# Patient Record
Sex: Male | Born: 1935 | Race: White | Hispanic: No | Marital: Married | Smoking: Former smoker
Health system: Southern US, Community
[De-identification: ages and names within clinical notes are randomized; demographics above are authoritative.]

## PROBLEM LIST (undated history)

## (undated) DIAGNOSIS — J449 Chronic obstructive pulmonary disease, unspecified: Secondary | ICD-10-CM

## (undated) DIAGNOSIS — E7800 Pure hypercholesterolemia, unspecified: Secondary | ICD-10-CM

## (undated) DIAGNOSIS — C801 Malignant (primary) neoplasm, unspecified: Secondary | ICD-10-CM

## (undated) DIAGNOSIS — D649 Anemia, unspecified: Secondary | ICD-10-CM

## (undated) DIAGNOSIS — I10 Essential (primary) hypertension: Secondary | ICD-10-CM

## (undated) HISTORY — DX: Anemia, unspecified: D64.9

## (undated) HISTORY — PX: PROSTATECTOMY: SHX69

## (undated) HISTORY — PX: TONSILLECTOMY: SUR1361

## (undated) HISTORY — PX: APPENDECTOMY: SHX54

## (undated) HISTORY — PX: BACK SURGERY: SHX140

---

## 2014-08-03 ENCOUNTER — Ambulatory Visit: Payer: Self-pay | Admitting: Nephrology

## 2014-08-09 ENCOUNTER — Ambulatory Visit: Payer: Self-pay | Admitting: Internal Medicine

## 2014-08-09 LAB — CANCER CENTER HEMOGLOBIN: HGB: 8.8 g/dL — ABNORMAL LOW (ref 13.0–18.0)

## 2014-08-10 LAB — KAPPA/LAMBDA FREE LIGHT CHAINS (ARMC)

## 2014-08-12 ENCOUNTER — Ambulatory Visit: Payer: Self-pay | Admitting: Internal Medicine

## 2014-08-19 LAB — CANCER CENTER HEMOGLOBIN: HGB: 8.8 g/dL — ABNORMAL LOW (ref 13.0–18.0)

## 2014-08-26 LAB — CANCER CENTER HEMOGLOBIN: HGB: 9.1 g/dL — AB (ref 13.0–18.0)

## 2014-09-02 LAB — CANCER CENTER HEMOGLOBIN: HGB: 9.3 g/dL — AB (ref 13.0–18.0)

## 2014-09-09 LAB — CANCER CENTER HEMOGLOBIN: HGB: 9.6 g/dL — ABNORMAL LOW (ref 13.0–18.0)

## 2014-09-12 ENCOUNTER — Ambulatory Visit: Payer: Self-pay | Admitting: Internal Medicine

## 2014-09-16 LAB — CANCER CENTER HEMOGLOBIN: HGB: 11 g/dL — AB (ref 13.0–18.0)

## 2014-09-29 LAB — CBC CANCER CENTER
Basophil #: 0.1 x10 3/mm (ref 0.0–0.1)
Basophil %: 0.7 %
Eosinophil #: 0 x10 3/mm (ref 0.0–0.7)
Eosinophil %: 0.6 %
HCT: 32.7 % — ABNORMAL LOW (ref 40.0–52.0)
HGB: 10.3 g/dL — ABNORMAL LOW (ref 13.0–18.0)
Lymphocyte #: 2.1 x10 3/mm (ref 1.0–3.6)
Lymphocyte %: 25.8 %
MCH: 30.9 pg (ref 26.0–34.0)
MCHC: 31.5 g/dL — ABNORMAL LOW (ref 32.0–36.0)
MCV: 98 fL (ref 80–100)
MONOS PCT: 5 %
Monocyte #: 0.4 x10 3/mm (ref 0.2–1.0)
Neutrophil #: 5.5 x10 3/mm (ref 1.4–6.5)
Neutrophil %: 67.9 %
Platelet: 266 x10 3/mm (ref 150–440)
RBC: 3.33 10*6/uL — ABNORMAL LOW (ref 4.40–5.90)
RDW: 16.5 % — ABNORMAL HIGH (ref 11.5–14.5)
WBC: 8.2 x10 3/mm (ref 3.8–10.6)

## 2014-10-04 LAB — CANCER CENTER HEMOGLOBIN: HGB: 10.4 g/dL — ABNORMAL LOW (ref 13.0–18.0)

## 2014-10-12 ENCOUNTER — Ambulatory Visit: Payer: Self-pay | Admitting: Internal Medicine

## 2014-10-14 LAB — CANCER CENTER HEMOGLOBIN: HGB: 9.5 g/dL — ABNORMAL LOW (ref 13.0–18.0)

## 2014-10-25 LAB — CANCER CENTER HEMOGLOBIN: HGB: 9.6 g/dL — AB (ref 13.0–18.0)

## 2014-11-03 LAB — FERRITIN: FERRITIN (ARMC): 44 ng/mL (ref 8–388)

## 2014-11-03 LAB — CANCER CENTER HEMOGLOBIN: HGB: 10.7 g/dL — ABNORMAL LOW (ref 13.0–18.0)

## 2014-11-12 ENCOUNTER — Ambulatory Visit: Payer: Self-pay | Admitting: Internal Medicine

## 2014-11-15 LAB — CANCER CENTER HEMOGLOBIN: HGB: 10.5 g/dL — ABNORMAL LOW (ref 13.0–18.0)

## 2014-11-25 LAB — CANCER CENTER HEMOGLOBIN: HGB: 10 g/dL — ABNORMAL LOW (ref 13.0–18.0)

## 2014-11-25 LAB — FERRITIN: Ferritin (ARMC): 127 ng/mL (ref 8–388)

## 2014-12-13 ENCOUNTER — Ambulatory Visit: Payer: Self-pay | Admitting: Internal Medicine

## 2014-12-16 LAB — CANCER CENTER HEMOGLOBIN: HGB: 9.9 g/dL — ABNORMAL LOW (ref 13.0–18.0)

## 2015-01-11 ENCOUNTER — Ambulatory Visit
Admit: 2015-01-11 | Disposition: A | Discharge status: Home / Self Care | Payer: Self-pay | Attending: Internal Medicine | Admitting: Internal Medicine

## 2015-02-11 ENCOUNTER — Ambulatory Visit
Admit: 2015-02-11 | Disposition: A | Discharge status: Home / Self Care | Payer: Self-pay | Attending: Internal Medicine | Admitting: Internal Medicine

## 2015-02-16 LAB — CANCER CENTER HEMOGLOBIN: HGB: 9.2 g/dL — ABNORMAL LOW (ref 13.0–18.0)

## 2015-03-09 LAB — CANCER CENTER HEMOGLOBIN: HGB: 8.9 g/dL — ABNORMAL LOW (ref 13.0–18.0)

## 2015-03-28 ENCOUNTER — Other Ambulatory Visit: Payer: Self-pay | Admitting: *Deleted

## 2015-03-28 DIAGNOSIS — N189 Chronic kidney disease, unspecified: Principal | ICD-10-CM

## 2015-03-28 DIAGNOSIS — D631 Anemia in chronic kidney disease: Secondary | ICD-10-CM

## 2015-03-30 ENCOUNTER — Inpatient Hospital Stay: Payer: Medicare Other | Attending: Internal Medicine

## 2015-03-30 ENCOUNTER — Inpatient Hospital Stay (HOSPITAL_BASED_OUTPATIENT_CLINIC_OR_DEPARTMENT_OTHER): Payer: Medicare Other | Admitting: Internal Medicine

## 2015-03-30 ENCOUNTER — Inpatient Hospital Stay: Payer: Medicare Other

## 2015-03-30 VITALS — BP 145/60 | HR 51 | Temp 98.5°F | Resp 18 | Wt 143.3 lb

## 2015-03-30 DIAGNOSIS — I651 Occlusion and stenosis of basilar artery: Secondary | ICD-10-CM | POA: Insufficient documentation

## 2015-03-30 DIAGNOSIS — F418 Other specified anxiety disorders: Secondary | ICD-10-CM | POA: Insufficient documentation

## 2015-03-30 DIAGNOSIS — J449 Chronic obstructive pulmonary disease, unspecified: Secondary | ICD-10-CM

## 2015-03-30 DIAGNOSIS — D649 Anemia, unspecified: Secondary | ICD-10-CM

## 2015-03-30 DIAGNOSIS — Z87891 Personal history of nicotine dependence: Secondary | ICD-10-CM | POA: Diagnosis not present

## 2015-03-30 DIAGNOSIS — R300 Dysuria: Secondary | ICD-10-CM | POA: Diagnosis not present

## 2015-03-30 DIAGNOSIS — K219 Gastro-esophageal reflux disease without esophagitis: Secondary | ICD-10-CM | POA: Insufficient documentation

## 2015-03-30 DIAGNOSIS — I739 Peripheral vascular disease, unspecified: Secondary | ICD-10-CM | POA: Diagnosis not present

## 2015-03-30 DIAGNOSIS — R5383 Other fatigue: Secondary | ICD-10-CM | POA: Diagnosis not present

## 2015-03-30 DIAGNOSIS — N189 Chronic kidney disease, unspecified: Secondary | ICD-10-CM | POA: Diagnosis not present

## 2015-03-30 DIAGNOSIS — Z79899 Other long term (current) drug therapy: Secondary | ICD-10-CM

## 2015-03-30 DIAGNOSIS — R7989 Other specified abnormal findings of blood chemistry: Secondary | ICD-10-CM | POA: Diagnosis not present

## 2015-03-30 DIAGNOSIS — D631 Anemia in chronic kidney disease: Secondary | ICD-10-CM | POA: Insufficient documentation

## 2015-03-30 DIAGNOSIS — I951 Orthostatic hypotension: Secondary | ICD-10-CM

## 2015-03-30 LAB — URINALYSIS COMPLETE WITH MICROSCOPIC (ARMC ONLY)
Bilirubin Urine: NEGATIVE
Glucose, UA: 50 mg/dL — AB
Hgb urine dipstick: NEGATIVE
Ketones, ur: NEGATIVE mg/dL
LEUKOCYTES UA: NEGATIVE
Nitrite: NEGATIVE
PH: 5 (ref 5.0–8.0)
Protein, ur: 100 mg/dL — AB
Specific Gravity, Urine: 1.011 (ref 1.005–1.030)

## 2015-03-30 LAB — HEMOGLOBIN: HEMOGLOBIN: 9.5 g/dL — AB (ref 13.0–18.0)

## 2015-03-30 LAB — FERRITIN: FERRITIN: 203 ng/mL (ref 24–336)

## 2015-03-30 MED ORDER — EPOETIN ALFA 40000 UNIT/ML IJ SOLN
40000.0000 [IU] | Freq: Once | INTRAMUSCULAR | Status: AC
  Administered 2015-03-30: 40000 [IU] via SUBCUTANEOUS
  Filled 2015-03-30: qty 1

## 2015-03-30 NOTE — Progress Notes (Signed)
[facility] Progress note   Referred by nephrology Dr. Zollie Scale. Primary care physicians have been a physicians making house call although he is recently changed doctors   This 81 y.o. male patient presents to the clinic for follow-up of anemia related to chronic renal failure   Chief Complaint/Problem list  1. anemia, hgb 8.7 on 07/15/14, attrubute tto azotemia, cre was 3.0, egfr 18., renal u/s consistent with medical rewnal disease recently iron sat , ferritin, spep normal, no urine, normal light chains documented  prior on aranesp in [place], pos ana and anti ds dna abs recently moved to [employer] 2. borderline neutrophilia likely spurrious , normal on 09/29/14   3. hx ca prostate   HPI: Today patient returns to follow-up, we will watch his hemoglobin, giving him Procrit when necessary, today he has an additional problem of burning urination and frequency at night for 2 weeks but not during the day and no suprapubic pain no fever chills sweats no back pain. He reports that his blood pressure is been up and down is on a new medication hydralazine his kidney function has declined that he is just seen Dr. Zollie Scale and has follow-up planned he's been coming in for hemoglobin check with the plan to give Procrit support 40,000 units when necessary for hemoglobin less than 10.3     Review of Systems:  No current headache or dizziness no chest pain or palpitation no abdominal pain no focal weakness not short of breath                        Allergies Not on File  Significant History/PMH: He has chronic renal failure, COPD with when necessary oxygen, anxiety and depression appendectomy, history of subdural hematoma, except postural hypotension GERD and peripheral vascular disease        Smoking History:  smoker for 40 years quit in 2000  [place]: Family History: Negative for malignancy   Comments:   Social History:  History  Alcohol Use: Not on file    Additional Past Medical and Surgical  History:    Home Medications: Prior to Admission medications   Medication Sig Start Date End Date Taking? Authorizing Provider  hydroxychloroquine (PLAQUENIL) 200 MG tablet Take by mouth. 11/15/14 11/15/15 Yes Historical Provider, MD  albuterol (PROVENTIL) (2.5 MG/3ML) 0.083% nebulizer solution Inhale into the lungs.    Historical Provider, MD  amLODipine (NORVASC) 10 MG tablet Take by mouth.    Historical Provider, MD  budesonide-formoterol (SYMBICORT) 160-4.5 MCG/ACT inhaler Inhale into the lungs.    Historical Provider, MD  calcitRIOL (ROCALTROL) 0.25 MCG capsule Take by mouth.    Historical Provider, MD  docusate sodium (COLACE) 100 MG capsule Take by mouth.    Historical Provider, MD  FLUoxetine (PROZAC) 20 MG capsule Take by mouth.    Historical Provider, MD  irbesartan (AVAPRO) 300 MG tablet Take by mouth.    Historical Provider, MD  meclizine (ANTIVERT) 12.5 MG tablet Take by mouth.    Historical Provider, MD  metoprolol (LOPRESSOR) 100 MG tablet Take by mouth.    Historical Provider, MD  OLANZapine (ZYPREXA) 2.5 MG tablet Take by mouth.    Historical Provider, MD  rosuvastatin (CRESTOR) 20 MG tablet Take by mouth.    Historical Provider, MD  tiotropium (SPIRIVA) 18 MCG inhalation capsule Place into inhaler and inhale.    Historical Provider, MD    Vital Signs:  Blood pressure 145/60, pulse 51, temperature 98.5 F (36.9 C), temperature source Tympanic,  resp. rate 18, weight 143 lb 4.8 oz (65 kg), SpO2 96 %.  Physical Exam:  General:  no acute distress  Mental Status: alert and oriented   Head, Ears, Nose,Throat: No thrush  Respiratory: no rales, rhonchi, or wheezing,   Cardiovascular: regular rate and rhythm  Gastrointestinal: soft, non tender, no masses or organomegaly  Musculoskeletal: no lower extremity edema     Neurological: No gross focal weakness cranial nerves intact  Lymphatics: Not palpable, neck supraclavicular, submandibular,     Psych: Mood, Affect, Unremarkable     Laboratory Results: Appointment on 03/30/2015  Component Date Value Ref Range Status  . Hemoglobin 03/30/2015 9.5* 13.0 - 18.0 g/dL Final  . Ferritin 03/30/2015 203  24 - 336 ng/mL Final  . Color, Urine 03/30/2015 YELLOW* YELLOW Final  . APPearance 03/30/2015 CLEAR* CLEAR Final  . Glucose, UA 03/30/2015 50* NEGATIVE mg/dL Final  . Bilirubin Urine 03/30/2015 NEGATIVE  NEGATIVE Final  . Ketones, ur 03/30/2015 NEGATIVE  NEGATIVE mg/dL Final  . Specific Gravity, Urine 03/30/2015 1.011  1.005 - 1.030 Final  . Hgb urine dipstick 03/30/2015 NEGATIVE  NEGATIVE Final  . pH 03/30/2015 5.0  5.0 - 8.0 Final  . Protein, ur 03/30/2015 100* NEGATIVE mg/dL Final  . Nitrite 03/30/2015 NEGATIVE  NEGATIVE Final  . Leukocytes, UA 03/30/2015 NEGATIVE  NEGATIVE Final  . RBC / HPF 03/30/2015 0-5  0 - 5 RBC/hpf Final  . WBC, UA 03/30/2015 0-5  0 - 5 WBC/hpf Final  . Bacteria, UA 03/30/2015 RARE* NONE SEEN Final  . Squamous Epithelial / LPF 03/30/2015 0-5* NONE SEEN Final  . Mucous 03/30/2015 PRESENT   Final          Radiology Results: No results found.         Assessment and Plan: Impression: ANEMIA ATTRIBUTE TO RENAL DISEASE, ELECTROPHERESIS, INCLUDING LIGHT CHAINS  NRMAL.[name] ABNORMAL SEROLOGY/ANA.  BORDERLINE NEUTROPHILIA, last recheck was normal. .  MILD SYMPTOMATIC WITH FATIGUE.PRIOR HX OF ARANESP . NOW S/P procrit prn, today hgb below target   also today has this urinary frequency and some burning sensations at night. The UA has been done it's is essentially looks normal. Culture is still pending.    Plan: Patient will increase his by mouth fluids. We will check if any positive culture. He'll report any new symptoms to Dr. Zollie Scale. We'll continue Procrit for now 40,000 units every 3 weeks. At intervals can check iron and be candidate for by mouth iron or intravenous iron possibly when necessary

## 2015-04-01 LAB — URINE CULTURE: Culture: NO GROWTH

## 2015-04-04 ENCOUNTER — Telehealth: Payer: Self-pay | Admitting: *Deleted

## 2015-04-04 NOTE — Telephone Encounter (Signed)
He was going to get labs drawn at [facility] to save time, but they have decided to have them drawn here to keep from coming twice to [place]. He will need appts and orders for his labs

## 2015-04-08 NOTE — Telephone Encounter (Signed)
I called Chad Simpson to inform him that this would be taken care of with his next appt. This was at 1519 on 04/08/15... This morning, 04/08/15 there was a message left from 04/05/15 off of my desk voicemail that the Chad Simpson had been trying to get through to the office 4-5 times regarding a UTI and possible antibiotic from [place]. When I asked Chad Simpson about this he stated that he has not called the [facility] all week. I asked him how he felt and if he needed anything. He said everything was fine.

## 2015-04-12 ENCOUNTER — Telehealth: Payer: Self-pay | Admitting: *Deleted

## 2015-04-12 ENCOUNTER — Other Ambulatory Visit: Payer: Self-pay | Admitting: *Deleted

## 2015-04-12 DIAGNOSIS — D631 Anemia in chronic kidney disease: Secondary | ICD-10-CM

## 2015-04-12 DIAGNOSIS — N189 Chronic kidney disease, unspecified: Principal | ICD-10-CM

## 2015-04-12 NOTE — Telephone Encounter (Signed)
Pt's daughter stated that she would be unable to bring her father to his appt on 04/13/15 and 04/20/15; she requested an appt of 04/27/15 for labs and possible Procrit with f/u MD visit 3 weeks later.Chad KitchenMarland KitchenWould like to have a standing order for his Procrit based on his labs as well.Chad KitchenMarland Simpson

## 2015-04-13 ENCOUNTER — Ambulatory Visit: Payer: Medicare Other

## 2015-04-13 ENCOUNTER — Other Ambulatory Visit: Payer: Medicare Other

## 2015-04-20 ENCOUNTER — Ambulatory Visit: Payer: Medicare Other | Admitting: Internal Medicine

## 2015-04-27 ENCOUNTER — Ambulatory Visit: Payer: Medicare Other

## 2015-04-27 ENCOUNTER — Inpatient Hospital Stay: Payer: Medicare Other | Attending: Family Medicine

## 2015-04-27 VITALS — BP 157/73 | HR 59 | Temp 96.2°F | Resp 18

## 2015-04-27 DIAGNOSIS — Z79899 Other long term (current) drug therapy: Secondary | ICD-10-CM | POA: Diagnosis not present

## 2015-04-27 DIAGNOSIS — D631 Anemia in chronic kidney disease: Secondary | ICD-10-CM | POA: Insufficient documentation

## 2015-04-27 DIAGNOSIS — N189 Chronic kidney disease, unspecified: Secondary | ICD-10-CM

## 2015-04-27 DIAGNOSIS — D649 Anemia, unspecified: Secondary | ICD-10-CM | POA: Insufficient documentation

## 2015-04-27 DIAGNOSIS — D508 Other iron deficiency anemias: Secondary | ICD-10-CM

## 2015-04-27 HISTORY — DX: Anemia, unspecified: D64.9

## 2015-04-27 LAB — HEMOGLOBIN: HEMOGLOBIN: 8.8 g/dL — AB (ref 13.0–18.0)

## 2015-04-27 MED ORDER — EPOETIN ALFA 40000 UNIT/ML IJ SOLN
40000.0000 [IU] | Freq: Once | INTRAMUSCULAR | Status: AC
  Administered 2015-04-27: 40000 [IU] via SUBCUTANEOUS
  Filled 2015-04-27: qty 1

## 2015-05-04 ENCOUNTER — Other Ambulatory Visit: Payer: Medicare Other

## 2015-05-04 ENCOUNTER — Ambulatory Visit: Payer: Medicare Other | Admitting: Family Medicine

## 2015-05-04 ENCOUNTER — Ambulatory Visit: Payer: Medicare Other

## 2015-05-04 ENCOUNTER — Other Ambulatory Visit: Payer: Self-pay | Admitting: *Deleted

## 2015-05-04 DIAGNOSIS — D649 Anemia, unspecified: Secondary | ICD-10-CM

## 2015-05-04 MED ORDER — FERROUS FUM-IRON POLYSACCH 162-115.2 MG PO CAPS: ORAL_CAPSULE | ORAL | Status: DC

## 2015-05-18 ENCOUNTER — Inpatient Hospital Stay: Payer: Medicare Other | Attending: Family Medicine

## 2015-05-18 ENCOUNTER — Telehealth: Payer: Self-pay | Admitting: *Deleted

## 2015-05-18 ENCOUNTER — Ambulatory Visit: Payer: Medicare Other

## 2015-05-18 ENCOUNTER — Inpatient Hospital Stay: Payer: Medicare Other | Admitting: Family Medicine

## 2015-05-18 ENCOUNTER — Encounter: Payer: Self-pay | Admitting: *Deleted

## 2015-05-18 ENCOUNTER — Ambulatory Visit: Payer: Medicare Other | Admitting: Family Medicine

## 2015-05-18 ENCOUNTER — Inpatient Hospital Stay: Payer: Medicare Other

## 2015-05-18 ENCOUNTER — Other Ambulatory Visit: Payer: Medicare Other

## 2015-05-18 VITALS — BP 109/64 | HR 55 | Temp 98.0°F | Resp 18

## 2015-05-18 DIAGNOSIS — D509 Iron deficiency anemia, unspecified: Secondary | ICD-10-CM

## 2015-05-18 DIAGNOSIS — D508 Other iron deficiency anemias: Secondary | ICD-10-CM

## 2015-05-18 DIAGNOSIS — D631 Anemia in chronic kidney disease: Secondary | ICD-10-CM | POA: Diagnosis not present

## 2015-05-18 DIAGNOSIS — N189 Chronic kidney disease, unspecified: Secondary | ICD-10-CM | POA: Diagnosis not present

## 2015-05-18 LAB — HEMOGLOBIN: Hemoglobin: 8.6 g/dL — ABNORMAL LOW (ref 13.0–18.0)

## 2015-05-18 LAB — FERRITIN: Ferritin: 208 ng/mL (ref 24–336)

## 2015-05-18 MED ORDER — EPOETIN ALFA 40000 UNIT/ML IJ SOLN
40000.0000 [IU] | Freq: Once | INTRAMUSCULAR | Status: DC
  Filled 2015-05-18: qty 1

## 2015-05-18 NOTE — Progress Notes (Signed)
I spoke with pt's daughter, Chad Simpson,  today and was informed that she would be unable to bring him to his 8 scheduled lab/Procrit visits; she scheduled 5 of the 8 visits with Chad Simpson.  She stated that she was concerned about him possibly receiving Feraheme as he has poor kidney function. He is no longer seeing his nephrologist due to his refusal of dialysis. Was wondering how the Procrit injections and Feraheme would affect her father's already declining kidney function. Stated that he already had a PMD and didn't see the need for frequent trips to the [facility]. I spoke with her some about anemia, and labs such as Hct and Ferritin levels; then gave her information on Procrit, and Feraheme along with her father's Hct results so that she could see how his levels trended. I also told her, as discussed with Chad Simpson, AGNP-C ,that the NP would like for Chad Simpson to continue taking his oral iron pills even if he should come in for Feraheme.Marland KitchenMarland KitchenAnd that I would give her a call back this afternoon to give her his Ferritin results.Marland KitchenMarland Kitchen

## 2015-05-18 NOTE — Telephone Encounter (Signed)
I called and informed pt's daughter that her father's ferritin level was 208 today; and he did not need any Feraheme at this time.

## 2015-05-19 NOTE — Progress Notes (Signed)
This encounter was created in error - please disregard.

## 2015-05-20 ENCOUNTER — Telehealth: Payer: Self-pay | Admitting: *Deleted

## 2015-05-20 NOTE — Telephone Encounter (Signed)
Returned call and left msg on vm that a 15 week supply was sent to [facility] on 6/22

## 2015-05-23 ENCOUNTER — Telehealth: Payer: Self-pay | Admitting: *Deleted

## 2015-05-23 NOTE — Telephone Encounter (Signed)
Per daughter's request, pt's last note was faxed to his Internal Medicine physician, Perrin Smack, MD with [facility] at [phone]; their pH. # is [phone].Marland KitchenMarland Kitchen

## 2015-05-25 ENCOUNTER — Other Ambulatory Visit: Payer: Medicare Other

## 2015-05-31 ENCOUNTER — Encounter: Admission: RE | Payer: Self-pay | Source: Ambulatory Visit

## 2015-05-31 ENCOUNTER — Ambulatory Visit: Admission: RE | Admit: 2015-05-31 | Payer: Medicare Other | Source: Ambulatory Visit | Admitting: Ophthalmology

## 2015-05-31 SURGERY — PHACOEMULSIFICATION, CATARACT, WITH IOL INSERTION
Anesthesia: Choice | Laterality: Left

## 2015-06-01 ENCOUNTER — Inpatient Hospital Stay: Payer: Medicare Other

## 2015-06-01 VITALS — BP 130/66 | HR 52 | Temp 98.5°F | Resp 18

## 2015-06-01 DIAGNOSIS — D509 Iron deficiency anemia, unspecified: Secondary | ICD-10-CM

## 2015-06-01 DIAGNOSIS — D631 Anemia in chronic kidney disease: Secondary | ICD-10-CM | POA: Diagnosis not present

## 2015-06-01 LAB — HEMOGLOBIN: Hemoglobin: 9.4 g/dL — ABNORMAL LOW (ref 13.0–18.0)

## 2015-06-01 MED ORDER — EPOETIN ALFA 40000 UNIT/ML IJ SOLN
40000.0000 [IU] | Freq: Once | INTRAMUSCULAR | Status: AC
  Administered 2015-06-01: 40000 [IU] via SUBCUTANEOUS
  Filled 2015-06-01: qty 1

## 2015-06-08 ENCOUNTER — Inpatient Hospital Stay: Payer: Medicare Other

## 2015-06-08 VITALS — BP 119/61 | HR 55 | Resp 18

## 2015-06-08 DIAGNOSIS — D509 Iron deficiency anemia, unspecified: Secondary | ICD-10-CM

## 2015-06-08 DIAGNOSIS — D631 Anemia in chronic kidney disease: Secondary | ICD-10-CM | POA: Diagnosis not present

## 2015-06-08 LAB — HEMOGLOBIN: HEMOGLOBIN: 9.9 g/dL — AB (ref 13.0–18.0)

## 2015-06-08 MED ORDER — EPOETIN ALFA 40000 UNIT/ML IJ SOLN
40000.0000 [IU] | Freq: Once | INTRAMUSCULAR | Status: AC
  Administered 2015-06-08: 40000 [IU] via SUBCUTANEOUS
  Filled 2015-06-08: qty 1

## 2015-06-12 ENCOUNTER — Emergency Department: Payer: Medicare Other

## 2015-06-12 ENCOUNTER — Ambulatory Visit: Payer: Self-pay | Admitting: Student

## 2015-06-12 ENCOUNTER — Inpatient Hospital Stay
Admission: EM | Admit: 2015-06-12 | Discharge: 2015-06-16 | DRG: 470 | Disposition: A | Discharge status: Skilled Nursing Facility | Payer: Medicare Other | Attending: Internal Medicine | Admitting: Internal Medicine

## 2015-06-12 DIAGNOSIS — Z9889 Other specified postprocedural states: Secondary | ICD-10-CM | POA: Diagnosis not present

## 2015-06-12 DIAGNOSIS — Z79899 Other long term (current) drug therapy: Secondary | ICD-10-CM | POA: Diagnosis not present

## 2015-06-12 DIAGNOSIS — M25551 Pain in right hip: Secondary | ICD-10-CM

## 2015-06-12 DIAGNOSIS — J449 Chronic obstructive pulmonary disease, unspecified: Secondary | ICD-10-CM | POA: Diagnosis present

## 2015-06-12 DIAGNOSIS — Z7951 Long term (current) use of inhaled steroids: Secondary | ICD-10-CM

## 2015-06-12 DIAGNOSIS — Z9079 Acquired absence of other genital organ(s): Secondary | ICD-10-CM | POA: Diagnosis present

## 2015-06-12 DIAGNOSIS — M25569 Pain in unspecified knee: Secondary | ICD-10-CM | POA: Diagnosis present

## 2015-06-12 DIAGNOSIS — Y92009 Unspecified place in unspecified non-institutional (private) residence as the place of occurrence of the external cause: Secondary | ICD-10-CM | POA: Diagnosis not present

## 2015-06-12 DIAGNOSIS — E780 Pure hypercholesterolemia: Secondary | ICD-10-CM | POA: Diagnosis present

## 2015-06-12 DIAGNOSIS — Z96649 Presence of unspecified artificial hip joint: Secondary | ICD-10-CM

## 2015-06-12 DIAGNOSIS — Z859 Personal history of malignant neoplasm, unspecified: Secondary | ICD-10-CM | POA: Diagnosis not present

## 2015-06-12 DIAGNOSIS — K5900 Constipation, unspecified: Secondary | ICD-10-CM | POA: Diagnosis present

## 2015-06-12 DIAGNOSIS — N183 Chronic kidney disease, stage 3 (moderate): Secondary | ICD-10-CM | POA: Diagnosis present

## 2015-06-12 DIAGNOSIS — F329 Major depressive disorder, single episode, unspecified: Secondary | ICD-10-CM | POA: Diagnosis present

## 2015-06-12 DIAGNOSIS — I129 Hypertensive chronic kidney disease with stage 1 through stage 4 chronic kidney disease, or unspecified chronic kidney disease: Secondary | ICD-10-CM | POA: Diagnosis present

## 2015-06-12 DIAGNOSIS — E785 Hyperlipidemia, unspecified: Secondary | ICD-10-CM | POA: Diagnosis present

## 2015-06-12 DIAGNOSIS — S72001A Fracture of unspecified part of neck of right femur, initial encounter for closed fracture: Principal | ICD-10-CM | POA: Diagnosis present

## 2015-06-12 DIAGNOSIS — W1830XA Fall on same level, unspecified, initial encounter: Secondary | ICD-10-CM | POA: Diagnosis present

## 2015-06-12 DIAGNOSIS — Z87891 Personal history of nicotine dependence: Secondary | ICD-10-CM

## 2015-06-12 DIAGNOSIS — D62 Acute posthemorrhagic anemia: Secondary | ICD-10-CM | POA: Diagnosis not present

## 2015-06-12 DIAGNOSIS — Z9049 Acquired absence of other specified parts of digestive tract: Secondary | ICD-10-CM | POA: Diagnosis present

## 2015-06-12 DIAGNOSIS — S72009A Fracture of unspecified part of neck of unspecified femur, initial encounter for closed fracture: Secondary | ICD-10-CM | POA: Diagnosis present

## 2015-06-12 HISTORY — DX: Malignant (primary) neoplasm, unspecified: C80.1

## 2015-06-12 HISTORY — DX: Chronic obstructive pulmonary disease, unspecified: J44.9

## 2015-06-12 HISTORY — DX: Essential (primary) hypertension: I10

## 2015-06-12 HISTORY — DX: Pure hypercholesterolemia, unspecified: E78.00

## 2015-06-12 LAB — BASIC METABOLIC PANEL
ANION GAP: 5 (ref 5–15)
BUN: 50 mg/dL — AB (ref 6–20)
CHLORIDE: 115 mmol/L — AB (ref 101–111)
CO2: 22 mmol/L (ref 22–32)
Calcium: 9 mg/dL (ref 8.9–10.3)
Creatinine, Ser: 4.08 mg/dL — ABNORMAL HIGH (ref 0.61–1.24)
GFR calc non Af Amer: 12 mL/min — ABNORMAL LOW (ref >60)
GFR, EST AFRICAN AMERICAN: 14 mL/min — AB (ref >60)
Glucose, Bld: 103 mg/dL — ABNORMAL HIGH (ref 65–99)
POTASSIUM: 4.1 mmol/L (ref 3.5–5.1)
Sodium: 142 mmol/L (ref 135–145)

## 2015-06-12 LAB — TROPONIN I

## 2015-06-12 LAB — CBC
HEMATOCRIT: 34.4 % — AB (ref 40.0–52.0)
HEMOGLOBIN: 11 g/dL — AB (ref 13.0–18.0)
MCH: 31.7 pg (ref 26.0–34.0)
MCHC: 32 g/dL (ref 32.0–36.0)
MCV: 99 fL (ref 80.0–100.0)
Platelets: 254 10*3/uL (ref 150–440)
RBC: 3.48 MIL/uL — ABNORMAL LOW (ref 4.40–5.90)
RDW: 20.2 % — ABNORMAL HIGH (ref 11.5–14.5)
WBC: 9.3 10*3/uL (ref 3.8–10.6)

## 2015-06-12 LAB — ABO/RH: ABO/RH(D): O POS

## 2015-06-12 LAB — TYPE AND SCREEN
ABO/RH(D): O POS
Antibody Screen: NEGATIVE

## 2015-06-12 LAB — PROTIME-INR
INR: 1
PROTHROMBIN TIME: 13.4 s (ref 11.4–15.0)

## 2015-06-12 LAB — APTT: APTT: 32 s (ref 24–36)

## 2015-06-12 MED ORDER — FE FUMARATE-B12-VIT C-FA-IFC PO CAPS
1.0000 | ORAL_CAPSULE | Freq: Every day | ORAL | Status: DC
  Administered 2015-06-14 – 2015-06-16 (×2): 1 via ORAL
  Filled 2015-06-12 (×3): qty 1

## 2015-06-12 MED ORDER — ROSUVASTATIN CALCIUM 20 MG PO TABS: 20.0000 mg | ORAL_TABLET | Freq: Every day | ORAL | Status: DC

## 2015-06-12 MED ORDER — METOPROLOL TARTRATE 100 MG PO TABS
100.0000 mg | ORAL_TABLET | Freq: Two times a day (BID) | ORAL | Status: DC
  Administered 2015-06-13 – 2015-06-16 (×8): 100 mg via ORAL
  Filled 2015-06-12 (×8): qty 1

## 2015-06-12 MED ORDER — CEFAZOLIN SODIUM-DEXTROSE 2-3 GM-% IV SOLR: 2.0000 g | Freq: Once | INTRAVENOUS | Status: AC

## 2015-06-12 MED ORDER — BUDESONIDE-FORMOTEROL FUMARATE 160-4.5 MCG/ACT IN AERO
2.0000 | INHALATION_SPRAY | Freq: Two times a day (BID) | RESPIRATORY_TRACT | Status: DC
  Administered 2015-06-13 – 2015-06-16 (×7): 2 via RESPIRATORY_TRACT
  Filled 2015-06-12: qty 6

## 2015-06-12 MED ORDER — DOCUSATE SODIUM 100 MG PO CAPS
100.0000 mg | ORAL_CAPSULE | Freq: Two times a day (BID) | ORAL | Status: DC
  Administered 2015-06-13: 100 mg via ORAL
  Filled 2015-06-12: qty 1

## 2015-06-12 MED ORDER — MORPHINE SULFATE 4 MG/ML IJ SOLN
INTRAMUSCULAR | Status: AC
  Administered 2015-06-12: 4 mg via INTRAVENOUS
  Filled 2015-06-12: qty 1

## 2015-06-12 MED ORDER — HYDROXYCHLOROQUINE SULFATE 200 MG PO TABS
200.0000 mg | ORAL_TABLET | Freq: Every day | ORAL | Status: DC
  Filled 2015-06-12: qty 1

## 2015-06-12 MED ORDER — OXYCODONE HCL 5 MG PO TABS
5.0000 mg | ORAL_TABLET | ORAL | Status: DC | PRN
  Administered 2015-06-13 (×2): 5 mg via ORAL
  Filled 2015-06-12 (×2): qty 1

## 2015-06-12 MED ORDER — OLANZAPINE 2.5 MG PO TABS
2.5000 mg | ORAL_TABLET | Freq: Every day | ORAL | Status: DC
  Administered 2015-06-14 – 2015-06-15 (×2): 2.5 mg via ORAL
  Filled 2015-06-12 (×4): qty 1

## 2015-06-12 MED ORDER — FERROUS FUM-IRON POLYSACCH 162-115.2 MG PO CAPS: 1.0000 | ORAL_CAPSULE | Freq: Every day | ORAL | Status: DC

## 2015-06-12 MED ORDER — ONDANSETRON HCL 4 MG PO TABS: 4.0000 mg | ORAL_TABLET | Freq: Four times a day (QID) | ORAL | Status: DC | PRN

## 2015-06-12 MED ORDER — IRBESARTAN 75 MG PO TABS
300.0000 mg | ORAL_TABLET | Freq: Every day | ORAL | Status: DC
  Administered 2015-06-14 – 2015-06-16 (×3): 300 mg via ORAL
  Filled 2015-06-12 (×3): qty 4

## 2015-06-12 MED ORDER — ONDANSETRON HCL 4 MG/2ML IJ SOLN
4.0000 mg | Freq: Once | INTRAMUSCULAR | Status: AC
  Administered 2015-06-12: 4 mg via INTRAVENOUS
  Filled 2015-06-12: qty 2

## 2015-06-12 MED ORDER — ACETAMINOPHEN 650 MG RE SUPP: 650.0000 mg | Freq: Four times a day (QID) | RECTAL | Status: DC | PRN

## 2015-06-12 MED ORDER — TIOTROPIUM BROMIDE MONOHYDRATE 18 MCG IN CAPS
18.0000 ug | ORAL_CAPSULE | Freq: Every day | RESPIRATORY_TRACT | Status: DC
  Administered 2015-06-13 – 2015-06-16 (×4): 18 ug via RESPIRATORY_TRACT
  Filled 2015-06-12: qty 5

## 2015-06-12 MED ORDER — MORPHINE SULFATE 4 MG/ML IJ SOLN
4.0000 mg | Freq: Once | INTRAMUSCULAR | Status: AC
  Administered 2015-06-12: 4 mg via INTRAVENOUS

## 2015-06-12 MED ORDER — MORPHINE SULFATE 2 MG/ML IJ SOLN
2.0000 mg | INTRAMUSCULAR | Status: DC | PRN
  Administered 2015-06-13 (×3): 2 mg via INTRAVENOUS
  Filled 2015-06-12 (×3): qty 1

## 2015-06-12 MED ORDER — SODIUM CHLORIDE 0.9 % IV SOLN
INTRAVENOUS | Status: DC
  Administered 2015-06-12 – 2015-06-13 (×4): via INTRAVENOUS

## 2015-06-12 MED ORDER — SIMVASTATIN 20 MG PO TABS
20.0000 mg | ORAL_TABLET | Freq: Every day | ORAL | Status: DC
  Administered 2015-06-14 – 2015-06-15 (×2): 20 mg via ORAL
  Filled 2015-06-12 (×2): qty 1

## 2015-06-12 MED ORDER — ONDANSETRON HCL 4 MG/2ML IJ SOLN: 4.0000 mg | Freq: Four times a day (QID) | INTRAMUSCULAR | Status: DC | PRN

## 2015-06-12 MED ORDER — MORPHINE SULFATE 4 MG/ML IJ SOLN
4.0000 mg | Freq: Once | INTRAMUSCULAR | Status: AC
Start: 2015-06-12 — End: 2015-06-12
  Administered 2015-06-12: 4 mg via INTRAVENOUS
  Filled 2015-06-12: qty 1

## 2015-06-12 MED ORDER — ACETAMINOPHEN 325 MG PO TABS: 650.0000 mg | ORAL_TABLET | Freq: Four times a day (QID) | ORAL | Status: DC | PRN

## 2015-06-12 MED ORDER — AMLODIPINE BESYLATE 10 MG PO TABS
10.0000 mg | ORAL_TABLET | Freq: Every day | ORAL | Status: DC
  Administered 2015-06-14 – 2015-06-16 (×3): 10 mg via ORAL
  Filled 2015-06-12 (×3): qty 1

## 2015-06-12 MED ORDER — POLYETHYLENE GLYCOL 3350 17 G PO PACK: 17.0000 g | PACK | Freq: Every day | ORAL | Status: DC | PRN

## 2015-06-12 MED ORDER — FLUOXETINE HCL 20 MG PO CAPS
20.0000 mg | ORAL_CAPSULE | Freq: Every day | ORAL | Status: DC
  Administered 2015-06-14 – 2015-06-16 (×3): 20 mg via ORAL
  Filled 2015-06-12 (×3): qty 1

## 2015-06-12 NOTE — Consult Note (Signed)
ORTHOPAEDIC CONSULTATION  PATIENT NAME: Chad Simpson DOB: Apr 30, 1930  MRN: [id number]  REQUESTING PHYSICIAN: Lytle Butte, MD  Chief Complaint: Right hip pain  HPI: Chad Simpson is a 81 y.o. male seen in consultation at the request of Lytle Butte, MD for  displaced right femoral neck fracture. The patient typically ambulates with a walker. He lives at [facility] with his wife. He was in the hallway today and tripped over his right foot landing on his right hip. He complains of deep pain within the right hip as well as right anterior knee pain. His daughters were present earlier this evening but have gone home. They have requested a telephone call in the morning. His creatinine today is 4.08 with a baseline creatinine of 3.  Past Medical History  Diagnosis Date  . Anemia 04/27/2015  . Cancer   . Hypertension   . COPD (chronic obstructive pulmonary disease)   . High cholesterol    Past Surgical History  Procedure Laterality Date  . Prostatectomy    . Appendectomy    . Tonsillectomy    . Back surgery     History   Social History  . Marital Status: Married    Spouse Name: N/A  . Number of Children: N/A  . Years of Education: N/A   Social History Main Topics  . Smoking status: Former Research scientist (life sciences)  . Smokeless tobacco: Not on file  . Alcohol Use: No  . Drug Use: Not on file  . Sexual Activity: Not on file   Other Topics Concern  . None   Social History Narrative   Family History  Problem Relation Age of Onset  . Heart failure Neg Hx    Allergies  Allergen Reactions  . Ciprofloxacin Nausea Only  . Erythromycin Nausea Only   Prior to Admission medications   Medication Sig Start Date End Date Taking? Authorizing Provider  amLODipine (NORVASC) 10 MG tablet Take 10 mg by mouth daily.    Yes Historical Provider, MD  budesonide-formoterol (SYMBICORT) 160-4.5 MCG/ACT inhaler Inhale 2 puffs into the lungs daily.    Yes Historical Provider, MD  calcitRIOL (ROCALTROL) 0.25  MCG capsule Take 0.25 mcg by mouth. Mondays, Wednesdays, and Fridays.   Yes Historical Provider, MD  docusate sodium (COLACE) 100 MG capsule Take 100 mg by mouth daily as needed. Three times a week as needed for constipation.   Yes Historical Provider, MD  FLUoxetine (PROZAC) 20 MG capsule Take 20 mg by mouth daily.    Yes Historical Provider, MD  hydrALAZINE (APRESOLINE) 10 MG tablet Take 10 mg by mouth 2 (two) times daily.   Yes Historical Provider, MD  hydroxychloroquine (PLAQUENIL) 200 MG tablet Take 200 mg by mouth daily. Mondays, Wednesdays, and Fridays. 11/15/14 11/15/15 Yes Historical Provider, MD  irbesartan (AVAPRO) 300 MG tablet Take 300 mg by mouth daily.    Yes Historical Provider, MD  meclizine (ANTIVERT) 12.5 MG tablet Take 12.5 mg by mouth at bedtime as needed for dizziness.    Yes Historical Provider, MD  metoprolol (LOPRESSOR) 100 MG tablet Take 100 mg by mouth 2 (two) times daily with a meal.    Yes Historical Provider, MD  OLANZapine (ZYPREXA) 2.5 MG tablet Take 2.5 mg by mouth daily.    Yes Historical Provider, MD  rosuvastatin (CRESTOR) 10 MG tablet Take 10 mg by mouth daily.   Yes Historical Provider, MD  ferrous fumarate-iron polysaccharide complex (TANDEM) 162-115.2 MG CAPS 1 tablet on Monday, Wednesday, and Friday 05/04/15  Evlyn Kanner, NP  tiotropium (SPIRIVA) 18 MCG inhalation capsule Place 18 mcg into inhaler and inhale daily.     Historical Provider, MD   Dg Chest 1 View  06/12/2015   CLINICAL DATA:  Fall at nursing home, now with right hip pain.  EXAM: CHEST  1 VIEW  COMPARISON:  None.  FINDINGS: Heart at the upper limits of normal in size with tortuous thoracic aorta. Lungs are hyperinflated with mild coarsened interstitial markings. Vague opacity projecting over the right lung apex, anterior first rib. No consolidation, pulmonary edema or pneumothorax. Blunting of both costophrenic angles favored to reflect hyperinflation versus pleural effusions. No acute osseous  abnormalities are seen. Mild scoliosis of the thoracic spine.  IMPRESSION: 1. Mild hyperinflation and coarse interstitial markings, suggestive of emphysema. 2. Vague opacity projecting over the right lung apex, first anterior right rib. This may reflect confluent overlapping structures, however underlying pulmonary nodule is not excluded. Chest CT recommended for further characterization.   Electronically Signed   By: Jeb Levering M.D.   On: 06/12/2015 19:05   Ct Head Wo Contrast  06/12/2015   CLINICAL DATA:  Fall, right head injury  EXAM: CT HEAD WITHOUT CONTRAST  TECHNIQUE: Contiguous axial images were obtained from the base of the skull through the vertex without intravenous contrast.  COMPARISON:  None.  FINDINGS: No evidence of parenchymal hemorrhage or extra-axial fluid collection. No mass lesion, mass effect, or midline shift.  No CT evidence of acute infarction.  Subcortical white matter and periventricular small vessel ischemic changes. Intracranial atherosclerosis.  Global cortical atrophy.  Secondary ventricular prominence.  The visualized paranasal sinuses are essentially clear. The mastoid air cells are unopacified.  No evidence of calvarial fracture.  IMPRESSION: No evidence of acute intracranial abnormality.  Atrophy with small vessel ischemic changes.   Electronically Signed   By: Julian Hy M.D.   On: 06/12/2015 18:26   Dg Knee Complete 4 Views Right  06/12/2015   CLINICAL DATA:  Fall.  Trauma to the RIGHT knee.  Knee pain.  EXAM: RIGHT KNEE - COMPLETE 4+ VIEW  COMPARISON:  None.  FINDINGS: Mild medial compartment osteoarthritis with tiny marginal osteophytes. The medial and lateral compartment joint spaces are preserved. Tibial plateau and fibula intact. Femur intact. Known knee effusion. Soft tissues appear within normal limits. Benign calcified chondroid lesion in the fibular neck. Atherosclerotic calcification in the superficial femoral and popliteal arteries.  IMPRESSION: Mild  medial compartment osteoarthritis. No acute osseous abnormality.   Electronically Signed   By: Dereck Ligas M.D.   On: 06/12/2015 19:04   Dg Hip Unilat With Pelvis 2-3 Views Right  06/12/2015   CLINICAL DATA:  Fall today.  RIGHT hip pain.  EXAM: DG HIP (WITH OR WITHOUT PELVIS) 2-3V RIGHT  COMPARISON:  None.  FINDINGS: Cervical RIGHT femoral neck fracture is present. Mild shortening of the proximal RIGHT femur. Surgical clips are present in the pelvis, likely for prostatectomy and lymphadenectomy. The LEFT hip appears normal. Atherosclerosis. Sacral arcades grossly appear intact. Obturator rings intact.  IMPRESSION: Cervical RIGHT femoral neck fracture.   Electronically Signed   By: Dereck Ligas M.D.   On: 06/12/2015 19:05    Review of systems: Denies fevers, denies chills  Physical Exam: General: Alert and alert in no acute distress. HEENT: Atraumatic and normocephalic. Sclera are clear. Extraocular motion is intact. Breathing is not labored Cardiovascular: No significant pretibial or ankle edema. Abdomen: Soft, nontender, and nondistended.  Skin: No lesions in the area of  chief complaint Neurologic: Awake, alert, and oriented. Sensory function is grossly intact. Motor strength is felt to be 5 over 5 bilaterally. No clonus or tremor. Good motor coordination. Lymphatic: No axillary or cervical lymphadenopathy MUSCULOSKELETAL: The patient lies with the right hip flexed and externally rotated. No pain with gentle hip range of motion. No swelling or bruising about the hip. The anterior knee is slightly tender over the anterior surface of the patella. There is no effusion. No palpable abnormalities within the extensor mechanism. Intact plantarflexion and dorsiflexion. Intact sensation in all distribution to the right lower extremity. Palpable dorsalis pedis pulses.  Imaging: Radiographs of the right knee reveal medial compartment osteoarthritis but no acute fractures. There is a displaced right  transcervical femoral neck fracture.   Laboratory Values: Creatinine 4.08 INR 1.0 Hemoglobin 11.0  Assessment: 81 year old gentleman with a displaced right femoral neck fracture. He has been seen and evaluated by the hospitalist service. He has been admitted to the hospital. We have recommended right hip hemiarthroplasty. His baseline creatinine is 3 and creatinine today is 4.08. This will be closely monitored postoperatively. Surgical consent was signed by the patient today. He understands risks associated with the procedure including not limited to damage neurovascular structures, infection, dislocation, postoperative pain, or complications from anesthesia.  Plan: 1. Bedrest overnight 2. I will defer placing him in skin traction because he is very comfortable lying in bed 3. Consent has been signed 4. Proceed with right hip hemiarthroplasty as the surgical schedule allows tomorrow.  Joshwa Hemric K. Page Spiro, MD

## 2015-06-12 NOTE — H&P (Signed)
[facility] [place] NAME: Chad Simpson    MR#:  [id number]  DATE OF BIRTH:  [birth year]   DATE OF ADMISSION:  06/12/2015  PRIMARY CARE PHYSICIAN: Leeroy Cha   REQUESTING/REFERRING PHYSICIAN: Schaevitz  CHIEF COMPLAINT:   Chief Complaint  Patient presents with  . Fall    HISTORY OF PRESENT ILLNESS:  Chad Simpson  is a 81 y.o. male with a known history of essential hypertension, chronic kidney disease baseline creatinine around 3 back in February 2017 presenting after mechanical fall. Patient suffered a mechanical fall while walking around his home, he has a shuffling gait at baseline uses a walker which predispose him following. He once again suffered a mechanical fall, no syncope no loss of consciousness no further symptomatology he did hit his head with without significant trauma. He experienced immediate pain over his right hip without radiation described only as "pain", intensity 7-8/10, worsened with movements, no relieving factors with the above presented to Hospital further workup and evaluation. Emergency department course: Found to have right-sided femoral neck fracture.  PAST MEDICAL HISTORY:   Past Medical History  Diagnosis Date  . Anemia 04/27/2015  . Cancer   . Hypertension   . COPD (chronic obstructive pulmonary disease)   . High cholesterol     PAST SURGICAL HISTORY:   Past Surgical History  Procedure Laterality Date  . Prostatectomy    . Appendectomy    . Tonsillectomy    . Back surgery      SOCIAL HISTORY:   History  Substance Use Topics  . Smoking status: Former Research scientist (life sciences)  . Smokeless tobacco: Not on file  . Alcohol Use: No    FAMILY HISTORY:   Family History  Problem Relation Age of Onset  . Heart failure Neg Hx     DRUG ALLERGIES:  Not on File  REVIEW OF SYSTEMS:  REVIEW OF SYSTEMS:  CONSTITUTIONAL: Denies fevers, chills, fatigue, weakness.  EYES: Denies blurred vision,  double vision, or eye pain.  EARS, NOSE, THROAT: Denies tinnitus, ear pain, hearing loss.  RESPIRATORY: denies cough, shortness of breath, wheezing positive dyspnea on exertion chronic CARDIOVASCULAR: Denies chest pain, palpitations, edema.  GASTROINTESTINAL: Denies nausea, vomiting, diarrhea, abdominal pain.  GENITOURINARY: Denies dysuria, hematuria.  ENDOCRINE: Denies nocturia or thyroid problems. HEMATOLOGIC AND LYMPHATIC: Denies easy bruising or bleeding.  SKIN: Denies rash or lesions.  MUSCULOSKELETAL: Denies pain in neck, back, shoulder, knees, hips, or further arthritic symptoms.  NEUROLOGIC: Denies paralysis, paresthesias.  PSYCHIATRIC: Denies anxiety or depressive symptoms. Otherwise full review of systems performed by me is negative.   MEDICATIONS AT HOME:   Prior to Admission medications   Medication Sig Start Date End Date Taking? Authorizing Provider  albuterol (PROVENTIL) (2.5 MG/3ML) 0.083% nebulizer solution Inhale into the lungs.    Historical Provider, MD  amLODipine (NORVASC) 10 MG tablet Take by mouth.    Historical Provider, MD  budesonide-formoterol (SYMBICORT) 160-4.5 MCG/ACT inhaler Inhale into the lungs.    Historical Provider, MD  calcitRIOL (ROCALTROL) 0.25 MCG capsule Take by mouth.    Historical Provider, MD  docusate sodium (COLACE) 100 MG capsule Take by mouth.    Historical Provider, MD  ferrous fumarate-iron polysaccharide complex (TANDEM) 162-115.2 MG CAPS 1 tablet on Monday, Wednesday, and Friday 05/04/15   Evlyn Kanner, NP  FLUoxetine (PROZAC) 20 MG capsule Take by mouth.    Historical Provider, MD  hydroxychloroquine (PLAQUENIL) 200 MG tablet Take by mouth. 11/15/14 11/15/15  Historical Provider, MD  irbesartan (AVAPRO) 300 MG tablet Take by mouth.    Historical Provider, MD  meclizine (ANTIVERT) 12.5 MG tablet Take by mouth.    Historical Provider, MD  metoprolol (LOPRESSOR) 100 MG tablet Take by mouth.    Historical Provider, MD  OLANZapine  (ZYPREXA) 2.5 MG tablet Take by mouth.    Historical Provider, MD  rosuvastatin (CRESTOR) 20 MG tablet Take by mouth.    Historical Provider, MD  tiotropium (SPIRIVA) 18 MCG inhalation capsule Place into inhaler and inhale.    Historical Provider, MD      VITAL SIGNS:  Blood pressure 187/65, pulse 73, temperature 98.5 F (36.9 C), temperature source Oral, height 5\' 7"  (1.702 m), weight 155 lb (70.308 kg), SpO2 97 %.  PHYSICAL EXAMINATION:  VITAL SIGNS: Filed Vitals:   06/12/15 1746  BP: 187/65  Pulse: 73  Temp: 98.5 F (36.9 C)   GENERAL:81 y.o.male currently in no acute distress.  HEAD: Normocephalic, atraumatic.  EYES: Pupils equal, round, reactive to light. Extraocular muscles intact. No scleral icterus.  MOUTH: Moist mucosal membrane. Dentition poor. No abscess noted.  EAR, NOSE, THROAT: Clear without exudates. No external lesions.  NECK: Supple. No thyromegaly. No nodules. No JVD.  PULMONARY: Clear to ascultation, scant wheeze no rails or rhonci. No use of accessory muscles, Good respiratory effort. good air entry bilaterally CHEST: Nontender to palpation.  CARDIOVASCULAR: S1 and S2. Regular rate and rhythm. No murmurs, rubs, or gallops. No edema. Pedal pulses 2+ bilaterally.  GASTROINTESTINAL: Soft, nontender, nondistended. No masses. Positive bowel sounds. No hepatosplenomegaly.  MUSCULOSKELETAL: No swelling, clubbing, or edema. Range of motion limited proximal right lower extremity secondary to fracture however distal range of motion intact NEUROLOGIC: Cranial nerves II through XII are intact. No gross focal neurological deficits. Sensation intact. Reflexes intact.  SKIN: No ulceration, lesions, rashes, or cyanosis. Skin warm and dry. Turgor intact.  PSYCHIATRIC: Mood, affect within normal limits. The patient is awake, alert and oriented x 3. Insight, judgment intact.    LABORATORY PANEL:   CBC  Recent Labs Lab 06/12/15 1815  WBC 9.3  HGB 11.0*  HCT 34.4*  PLT 254    ------------------------------------------------------------------------------------------------------------------  Chemistries   Recent Labs Lab 06/12/15 1815  NA 142  K 4.1  CL 115*  CO2 22  GLUCOSE 103*  BUN 50*  CREATININE 4.08*  CALCIUM 9.0   ------------------------------------------------------------------------------------------------------------------  Cardiac Enzymes  Recent Labs Lab 06/12/15 1815  TROPONINI <0.03   ------------------------------------------------------------------------------------------------------------------  RADIOLOGY:  Dg Chest 1 View  06/12/2015   CLINICAL DATA:  Fall at nursing home, now with right hip pain.  EXAM: CHEST  1 VIEW  COMPARISON:  None.  FINDINGS: Heart at the upper limits of normal in size with tortuous thoracic aorta. Lungs are hyperinflated with mild coarsened interstitial markings. Vague opacity projecting over the right lung apex, anterior first rib. No consolidation, pulmonary edema or pneumothorax. Blunting of both costophrenic angles favored to reflect hyperinflation versus pleural effusions. No acute osseous abnormalities are seen. Mild scoliosis of the thoracic spine.  IMPRESSION: 1. Mild hyperinflation and coarse interstitial markings, suggestive of emphysema. 2. Vague opacity projecting over the right lung apex, first anterior right rib. This may reflect confluent overlapping structures, however underlying pulmonary nodule is not excluded. Chest CT recommended for further characterization.   Electronically Signed   By: Jeb Levering M.D.   On: 06/12/2015 19:05   Ct Head Wo Contrast  06/12/2015   CLINICAL DATA:  Fall, right head injury  EXAM: CT HEAD WITHOUT CONTRAST  TECHNIQUE: Contiguous axial images were obtained from the base of the skull through the vertex without intravenous contrast.  COMPARISON:  None.  FINDINGS: No evidence of parenchymal hemorrhage or extra-axial fluid collection. No mass lesion, mass effect,  or midline shift.  No CT evidence of acute infarction.  Subcortical white matter and periventricular small vessel ischemic changes. Intracranial atherosclerosis.  Global cortical atrophy.  Secondary ventricular prominence.  The visualized paranasal sinuses are essentially clear. The mastoid air cells are unopacified.  No evidence of calvarial fracture.  IMPRESSION: No evidence of acute intracranial abnormality.  Atrophy with small vessel ischemic changes.   Electronically Signed   By: Julian Hy M.D.   On: 06/12/2015 18:26   Dg Knee Complete 4 Views Right  06/12/2015   CLINICAL DATA:  Fall.  Trauma to the RIGHT knee.  Knee pain.  EXAM: RIGHT KNEE - COMPLETE 4+ VIEW  COMPARISON:  None.  FINDINGS: Mild medial compartment osteoarthritis with tiny marginal osteophytes. The medial and lateral compartment joint spaces are preserved. Tibial plateau and fibula intact. Femur intact. Known knee effusion. Soft tissues appear within normal limits. Benign calcified chondroid lesion in the fibular neck. Atherosclerotic calcification in the superficial femoral and popliteal arteries.  IMPRESSION: Mild medial compartment osteoarthritis. No acute osseous abnormality.   Electronically Signed   By: Dereck Ligas M.D.   On: 06/12/2015 19:04   Dg Hip Unilat With Pelvis 2-3 Views Right  06/12/2015   CLINICAL DATA:  Fall today.  RIGHT hip pain.  EXAM: DG HIP (WITH OR WITHOUT PELVIS) 2-3V RIGHT  COMPARISON:  None.  FINDINGS: Cervical RIGHT femoral neck fracture is present. Mild shortening of the proximal RIGHT femur. Surgical clips are present in the pelvis, likely for prostatectomy and lymphadenectomy. The LEFT hip appears normal. Atherosclerosis. Sacral arcades grossly appear intact. Obturator rings intact.  IMPRESSION: Cervical RIGHT femoral neck fracture.   Electronically Signed   By: Dereck Ligas M.D.   On: 06/12/2015 19:05    EKG:   Orders placed or performed during the hospital encounter of 06/12/15  . ED EKG   . ED EKG  . ED EKG  . ED EKG    IMPRESSION AND PLAN:   81 year old Caucasian gentleman history of chronic kidney disease, essential hypertension presenting after mechanical fall.  1. Preoperative evaluation for right femoral neck fracture: He should be considered moderate risk for moderate risk surgery from cardiac standpoint he will require no further intervention or testing prior to surgery. His METs should be considered less than 4 given generalized dismobility. He has no active cardiovascular symptoms including angina, severe valvular dysfunction, sign symptoms of severe congestive heart failure, severe arrhythmia. As far as medications are concerned continue all his home medications perioperative period-aside from Avapro which can be restarted postoperatively and hopes to avoid intraoperative hypotension  Consult orthopedic surgery case discussed by emergency department staff, placed nothing by mouth past midnight, avoid aspirin and heparin 2. Chronic kidney disease baseline creatinine around 3: Currently creatinine for gentle IV fluid hydration and follow urine output renal function 3. Essential hypertension: Continue with the Norvasc and metoprolol 4. Hyperlipidemia unspecified: Statin therapy 5. Venous thromboembolism prophylactic: SCDs    All the records are reviewed and case discussed with ED provider. Management plans discussed with the patient, family and they are in agreement.  CODE STATUS: Full  TOTAL TIME TAKING CARE OF THIS PATIENT: 35 minutes.    Rafeef Lau,  Karenann Cai.D on 06/12/2015 at 8:43  PM  Between 7am to 6pm - Pager - [phone]  After 6pm: House Pager: [phone]  [facility]  Office  [phone]  CC: Primary care physician; Leeroy Cha

## 2015-06-12 NOTE — ED Provider Notes (Signed)
[facility] Emergency Department Provider Note  ____________________________________________  Time seen: Seen upon arrival to the emergency department  I have reviewed the triage vital signs and the nursing notes.   HISTORY  Chief Complaint Fall    HPI Chad Simpson is a 81 y.o. male with a history of COPD and hypertension who presents today after a mechanical fall at his home. The patient twisted his right foot and then fell to his right side hitting his head. He also hit the right hip and now has pain to the right groin. Denies any loss of consciousness. Does not take any blood thinners.   Past Medical History  Diagnosis Date  . Anemia 04/27/2015  . Cancer   . Hypertension   . COPD (chronic obstructive pulmonary disease)   . High cholesterol     Patient Active Problem List   Diagnosis Date Noted  . Anemia 04/27/2015  . Dysuria 03/30/2015    Past Surgical History  Procedure Laterality Date  . Prostatectomy    . Appendectomy    . Tonsillectomy    . Back surgery      Current Outpatient Rx  Name  Route  Sig  Dispense  Refill  . albuterol (PROVENTIL) (2.5 MG/3ML) 0.083% nebulizer solution   Inhalation   Inhale into the lungs.         [name] amLODipine (NORVASC) 10 MG tablet   Oral   Take by mouth.         . budesonide-formoterol (SYMBICORT) 160-4.5 MCG/ACT inhaler   Inhalation   Inhale into the lungs.         . calcitRIOL (ROCALTROL) 0.25 MCG capsule   Oral   Take by mouth.         . docusate sodium (COLACE) 100 MG capsule   Oral   Take by mouth.         . ferrous fumarate-iron polysaccharide complex (TANDEM) 162-115.2 MG CAPS      1 tablet on Monday, Wednesday, and Friday   45 capsule   1   . FLUoxetine (PROZAC) 20 MG capsule   Oral   Take by mouth.         . hydroxychloroquine (PLAQUENIL) 200 MG tablet   Oral   Take by mouth.         . irbesartan (AVAPRO) 300 MG tablet   Oral   Take by mouth.         .  meclizine (ANTIVERT) 12.5 MG tablet   Oral   Take by mouth.         . metoprolol (LOPRESSOR) 100 MG tablet   Oral   Take by mouth.         . OLANZapine (ZYPREXA) 2.5 MG tablet   Oral   Take by mouth.         . rosuvastatin (CRESTOR) 20 MG tablet   Oral   Take by mouth.         . tiotropium (SPIRIVA) 18 MCG inhalation capsule   Inhalation   Place into inhaler and inhale.           Allergies Review of patient's allergies indicates not on file.  No family history on file.  Social History History  Substance Use Topics  . Smoking status: Former Research scientist (life sciences)  . Smokeless tobacco: Not on file  . Alcohol Use: No    Review of Systems Constitutional: No fever/chills Eyes: No visual changes. ENT: No sore throat. Cardiovascular: Denies chest pain. Respiratory:  Denies shortness of breath. Gastrointestinal: No abdominal pain.  No nausea, no vomiting.  No diarrhea.  No constipation. Genitourinary: Negative for dysuria. Musculoskeletal: Negative for back pain. Skin: Negative for rash. Neurological: Negative for headaches, focal weakness or numbness.  10-point ROS otherwise negative.  ____________________________________________   PHYSICAL EXAM:  VITAL SIGNS: ED Triage Vitals  Enc Vitals Group     BP 06/12/15 1746 187/65 mmHg     Pulse Rate 06/12/15 1746 73     Resp --      Temp 06/12/15 1746 98.5 F (36.9 C)     Temp Source 06/12/15 1746 Oral     SpO2 06/12/15 1746 97 %     Weight 06/12/15 1746 155 lb (70.308 kg)     Height 06/12/15 1746 5\' 7"  (1.702 m)     Head Cir --      Peak Flow --      Pain Score 06/12/15 1747 3     Pain Loc --      Pain Edu? --      Excl. in [place]? --     Constitutional: Alert and oriented. Well appearing and in no acute distress. Eyes: Conjunctivae are normal. PERRL. EOMI. Head: Atraumatic. Nose: No congestion/rhinnorhea. Mouth/Throat: Mucous membranes are moist.  Oropharynx non-erythematous. Neck: No stridor.  No C-spine  tenderness palpation. Ranging neck freely without any pain. Cardiovascular: Normal rate, regular rhythm. Grossly normal heart sounds.  Good peripheral circulation. Respiratory: Normal respiratory effort.  No retractions. Lungs CTAB. Gastrointestinal: Soft and nontender. No distention. No abdominal bruits. No CVA tenderness. Musculoskeletal: Diffuse right knee tenderness to palpation without any swelling or ligamentous instability. Unable to range the right hip or knee secondary to pain. No deformity visualized. Dorsalis pedis pulses intact bilaterally. Tenderness palpation to the proximal medial femur. No obvious shortening or rotation. However, patient is unable to move right lower extremity from a flexed at the hip position.  No joint effusions. Neurologic:  Normal speech and language. No gross focal neurologic deficits are appreciated. No gait instability. Skin:  Skin is warm, dry and intact. No rash noted. Psychiatric: Mood and affect are normal. Speech and behavior are normal.  ____________________________________________   LABS (all labs ordered are listed, but only abnormal results are displayed)  Labs Reviewed  CBC - Abnormal; Notable for the following:    RBC 3.48 (*)    Hemoglobin 11.0 (*)    HCT 34.4 (*)    RDW 20.2 (*)    All other components within normal limits  BASIC METABOLIC PANEL - Abnormal; Notable for the following:    Chloride 115 (*)    Glucose, Bld 103 (*)    BUN 50 (*)    Creatinine, Ser 4.08 (*)    GFR calc non Af Amer 12 (*)    GFR calc Af Amer 14 (*)    All other components within normal limits  TROPONIN I  PROTIME-INR  APTT  TYPE AND SCREEN  ABO/RH   ____________________________________________  EKG  ED ECG REPORT I, Doran Stabler, the attending physician, personally viewed and interpreted this ECG.   Date: 06/12/2015  EKG Time: 1807  Rate: 73  Rhythm: normal sinus rhythm  Axis: Normal axis  Intervals:none  ST&T Change: No ST segment  elevation or depression. T-wave inversion in V2 and V3.  ____________________________________________  RADIOLOGY  No acute abnormality to the knee. Chest with right-sided overlying structures versus nodule. Patient with remote history of surgery to the right thorax. Possibly scarring.  Right hip with cervical neck fracture. I personally reviewed these images of both the hip and the chest. ____________________________________________   PROCEDURES   ____________________________________________   INITIAL IMPRESSION / ASSESSMENT AND PLAN / ED COURSE  Pertinent labs & imaging results that were available during my care of the patient were reviewed by me and considered in my medical decision making (see chart for details).  ----------------------------------------- 8:14 PM on 06/12/2015 -----------------------------------------  Reviewed the images as well as the diagnosis with the patient and his family. Understand the patient will need be admitted for likely repair of the right hip. Discussed with Dr. hasty of orthopedics who will see as a consult. Signed out to Dr. Margaretmary Eddy.  Patient does have elevated creatinine. Unclear baseline but family says that the GFR is in the teens at his last evaluation. ____________________________________________   FINAL CLINICAL IMPRESSION(S) / ED DIAGNOSES  Right cervical hip fracture. Acute mechanical fall.    Orbie Pyo, MD 06/12/15 2016

## 2015-06-12 NOTE — ED Notes (Signed)
Patient fell about one hour ago at [facility]. Hit right side of head and right knee. Pain to right groin area.

## 2015-06-12 NOTE — Progress Notes (Signed)
Unable to insert foley catheter

## 2015-06-13 ENCOUNTER — Inpatient Hospital Stay: Payer: Medicare Other | Admitting: Anesthesiology

## 2015-06-13 ENCOUNTER — Encounter
Admission: EM | Disposition: A | Discharge status: Skilled Nursing Facility | Payer: Self-pay | Source: Home / Self Care | Attending: Specialist

## 2015-06-13 HISTORY — PX: HIP ARTHROPLASTY: SHX981

## 2015-06-13 LAB — CBC
HCT: 32 % — ABNORMAL LOW (ref 40.0–52.0)
Hemoglobin: 10 g/dL — ABNORMAL LOW (ref 13.0–18.0)
MCH: 31 pg (ref 26.0–34.0)
MCHC: 31.2 g/dL — ABNORMAL LOW (ref 32.0–36.0)
MCV: 99.3 fL (ref 80.0–100.0)
PLATELETS: 232 10*3/uL (ref 150–440)
RBC: 3.23 MIL/uL — ABNORMAL LOW (ref 4.40–5.90)
RDW: 19.7 % — ABNORMAL HIGH (ref 11.5–14.5)
WBC: 12.7 10*3/uL — ABNORMAL HIGH (ref 3.8–10.6)

## 2015-06-13 LAB — SURGICAL PCR SCREEN
MRSA, PCR: NEGATIVE
STAPHYLOCOCCUS AUREUS: NEGATIVE

## 2015-06-13 LAB — BASIC METABOLIC PANEL
ANION GAP: 5 (ref 5–15)
BUN: 49 mg/dL — ABNORMAL HIGH (ref 6–20)
CO2: 19 mmol/L — ABNORMAL LOW (ref 22–32)
CREATININE: 3.8 mg/dL — AB (ref 0.61–1.24)
Calcium: 8.6 mg/dL — ABNORMAL LOW (ref 8.9–10.3)
Chloride: 117 mmol/L — ABNORMAL HIGH (ref 101–111)
GFR calc Af Amer: 16 mL/min — ABNORMAL LOW (ref >60)
GFR calc non Af Amer: 13 mL/min — ABNORMAL LOW (ref >60)
GLUCOSE: 115 mg/dL — AB (ref 65–99)
POTASSIUM: 4.6 mmol/L (ref 3.5–5.1)
Sodium: 141 mmol/L (ref 135–145)

## 2015-06-13 SURGERY — HEMIARTHROPLASTY, HIP, DIRECT ANTERIOR APPROACH, FOR FRACTURE
Anesthesia: Spinal | Laterality: Right

## 2015-06-13 MED ORDER — NEOMYCIN-POLYMYXIN B GU 40-200000 IR SOLN: Status: DC | PRN

## 2015-06-13 MED ORDER — ATROPINE SULFATE 0.4 MG/ML IJ SOLN
INTRAMUSCULAR | Status: DC | PRN
Start: 2015-06-13 — End: 2015-06-14
  Administered 2015-06-13: 0.4 mg via INTRAVENOUS

## 2015-06-13 MED ORDER — KETAMINE HCL 10 MG/ML IJ SOLN
INTRAMUSCULAR | Status: DC | PRN
  Administered 2015-06-13: 10 mg via INTRAVENOUS

## 2015-06-13 MED ORDER — PROPOFOL INFUSION 10 MG/ML OPTIME
INTRAVENOUS | Status: DC | PRN
  Administered 2015-06-13: 15 ug/kg/min via INTRAVENOUS

## 2015-06-13 MED ORDER — CEFAZOLIN SODIUM-DEXTROSE 2-3 GM-% IV SOLR
2.0000 g | INTRAVENOUS | Status: AC
  Administered 2015-06-13: 2 g via INTRAVENOUS
  Filled 2015-06-13: qty 50

## 2015-06-13 MED ORDER — MIDAZOLAM HCL 5 MG/5ML IJ SOLN
INTRAMUSCULAR | Status: DC | PRN
  Administered 2015-06-13: 1 mg via INTRAVENOUS

## 2015-06-13 MED ORDER — SODIUM CHLORIDE 0.9 % IV SOLN
10000.0000 ug | INTRAVENOUS | Status: DC | PRN
  Administered 2015-06-13: 0.033 ug/min via INTRAVENOUS

## 2015-06-13 MED ORDER — PHENYLEPHRINE HCL 10 MG/ML IJ SOLN
INTRAMUSCULAR | Status: AC
  Filled 2015-06-13: qty 1

## 2015-06-13 MED ORDER — EPHEDRINE SULFATE 50 MG/ML IJ SOLN
INTRAMUSCULAR | Status: DC | PRN
  Administered 2015-06-13 (×3): 5 mg via INTRAVENOUS
  Administered 2015-06-13 (×2): 10 mg via INTRAVENOUS

## 2015-06-13 MED ORDER — NEOMYCIN-POLYMYXIN B GU 40-200000 IR SOLN
Status: AC
  Filled 2015-06-13: qty 20

## 2015-06-13 MED ORDER — HYDRALAZINE HCL 20 MG/ML IJ SOLN
5.0000 mg | Freq: Once | INTRAMUSCULAR | Status: AC
  Administered 2015-06-13: 5 mg via INTRAVENOUS
  Filled 2015-06-13: qty 1

## 2015-06-13 MED ORDER — PHENYLEPHRINE HCL 10 MG/ML IJ SOLN
INTRAMUSCULAR | Status: DC | PRN
  Administered 2015-06-13 (×2): 80 ug via INTRAVENOUS

## 2015-06-13 MED ORDER — PROPOFOL 10 MG/ML IV BOLUS
INTRAVENOUS | Status: DC | PRN
  Administered 2015-06-13: 20 mg via INTRAVENOUS

## 2015-06-13 SURGICAL SUPPLY — 57 items
BAG DECANTER STRL (MISCELLANEOUS) ×3 IMPLANT
BLADE SAW 1 (BLADE) ×3 IMPLANT
CANISTER SUCT 1200ML W/VALVE (MISCELLANEOUS) ×3 IMPLANT
CANISTER SUCT 3000ML (MISCELLANEOUS) ×6 IMPLANT
CAPT HIP HEMI 1 ×3 IMPLANT
CATH FOL LEG HOLDER (MISCELLANEOUS) ×3 IMPLANT
CEMENT HV SMART SET (Cement) ×6 IMPLANT
DRAPE INCISE IOBAN 66X60 STRL (DRAPES) ×3 IMPLANT
DRAPE SHEET LG 3/4 BI-LAMINATE (DRAPES) ×3 IMPLANT
DRAPE TABLE BACK 80X90 (DRAPES) ×3 IMPLANT
DRSG DERMACEA 8X12 NADH (GAUZE/BANDAGES/DRESSINGS) ×3 IMPLANT
DRSG OPSITE POSTOP 4X12 (GAUZE/BANDAGES/DRESSINGS) ×3 IMPLANT
DRSG OPSITE POSTOP 4X14 (GAUZE/BANDAGES/DRESSINGS) IMPLANT
DRSG TEGADERM 4X4.75 (GAUZE/BANDAGES/DRESSINGS) ×6 IMPLANT
DURAPREP 26ML APPLICATOR (WOUND CARE) ×6 IMPLANT
ELECT BLADE 6.5 EXT (BLADE) ×3 IMPLANT
ELECT CAUTERY BLADE 6.4 (BLADE) ×3 IMPLANT
GAUZE PACK 2X3YD (MISCELLANEOUS) ×3 IMPLANT
GAUZE SPONGE 4X4 12PLY STRL (GAUZE/BANDAGES/DRESSINGS) IMPLANT
GLOVE BIO SURGEON STRL SZ8 (GLOVE) ×6 IMPLANT
GLOVE BIOGEL M STRL SZ7.5 (GLOVE) ×6 IMPLANT
GLOVE BIOGEL PI IND STRL 9 (GLOVE) IMPLANT
GLOVE BIOGEL PI INDICATOR 9 (GLOVE)
GLOVE INDICATOR 8.0 STRL GRN (GLOVE) ×6 IMPLANT
GOWN STRL REUS W/ TWL LRG LVL3 (GOWN DISPOSABLE) ×1 IMPLANT
GOWN STRL REUS W/TWL 2XL LVL3 (GOWN DISPOSABLE) IMPLANT
GOWN STRL REUS W/TWL LRG LVL3 (GOWN DISPOSABLE) ×2
GOWN STRL REUS W/TWL LRG LVL4 (GOWN DISPOSABLE) IMPLANT
GOWN STRL REUS W/TWL XL LVL4 (GOWN DISPOSABLE) ×6 IMPLANT
HANDPIECE SUCTION TUBG SURGILV (MISCELLANEOUS) ×3 IMPLANT
HEMOVAC 400CC 10FR (MISCELLANEOUS) ×3 IMPLANT
HOOD PEEL AWAY FACE SHEILD DIS (HOOD) ×3 IMPLANT
KIT RM TURNOVER STRD PROC AR (KITS) ×3 IMPLANT
NDL SAFETY 18GX1.5 (NEEDLE) ×3 IMPLANT
NEEDLE FILTER BLUNT 18X 1/2SAF (NEEDLE) ×2
NEEDLE FILTER BLUNT 18X1 1/2 (NEEDLE) ×1 IMPLANT
NS IRRIG 1000ML POUR BTL (IV SOLUTION) ×3 IMPLANT
PACK HIP PROSTHESIS (MISCELLANEOUS) ×3 IMPLANT
PAD GROUND ADULT SPLIT (MISCELLANEOUS) ×3 IMPLANT
PRESSURIZER CEMENT PROX FEM SM (MISCELLANEOUS) ×3 IMPLANT
PRESSURIZER FEM CANAL M (MISCELLANEOUS) ×3 IMPLANT
SOL .9 NS 3000ML IRR  AL (IV SOLUTION) ×2
SOL .9 NS 3000ML IRR UROMATIC (IV SOLUTION) ×1 IMPLANT
SPONGE DRAIN TRACH 4X4 STRL 2S (GAUZE/BANDAGES/DRESSINGS) ×6 IMPLANT
STAPLER SKIN PROX 35W (STAPLE) ×3 IMPLANT
SUT ETHIBOND #5 BRAIDED 30INL (SUTURE) ×3 IMPLANT
SUT VIC AB 0 CT1 36 (SUTURE) ×3 IMPLANT
SUT VIC AB 1 CT1 36 (SUTURE) ×6 IMPLANT
SUT VIC AB 2-0 CT1 27 (SUTURE) ×2
SUT VIC AB 2-0 CT1 TAPERPNT 27 (SUTURE) ×1 IMPLANT
SYR 20CC LL (SYRINGE) ×3 IMPLANT
SYR TB 1ML LUER SLIP (SYRINGE) ×3 IMPLANT
TAPE MICROFOAM 4IN (TAPE) IMPLANT
TAPE TRANSPORE STRL 2 31045 (GAUZE/BANDAGES/DRESSINGS) ×3 IMPLANT
TIP COAXIAL FEMORAL CANAL (MISCELLANEOUS) ×3 IMPLANT
TOWER CARTRIDGE SMART MIX (DISPOSABLE) ×3 IMPLANT
WATER STERILE IRR 1000ML POUR (IV SOLUTION) IMPLANT

## 2015-06-13 NOTE — Anesthesia Procedure Notes (Signed)
Spinal Patient location during procedure: OR Staffing Performed by: anesthesiologist  Preanesthetic Checklist Completed: patient identified, site marked, surgical consent, pre-op evaluation, timeout performed, IV checked, risks and benefits discussed and monitors and equipment checked Spinal Block Patient position: right lateral decubitus Prep: Betadine Patient monitoring: heart rate, cardiac monitor, continuous pulse ox and blood pressure Location: L3-4 Injection technique: single-shot Needle Needle type: Quincke  Needle gauge: 22 G Needle length: 10 cm Assessment Sensory level: T4 Additional Notes Tolerated well no paresthesia vsst +csf pre and post.

## 2015-06-13 NOTE — Progress Notes (Signed)
[facility] NAME: Chad Simpson    MR#:  [id number]  DATE OF BIRTH:  1929/11/24  SUBJECTIVE:  CHIEF COMPLAINT:   Chief Complaint  Patient presents with  . Fall   Patient here due to mechanical fall and noted to have a right hip fracture. Patient going to the OR later today.   Having some pain in the right hip and also his lower back.  REVIEW OF SYSTEMS:    Review of Systems  Constitutional: Negative for fever and chills.  HENT: Negative for congestion and tinnitus.   Eyes: Negative for blurred vision and double vision.  Respiratory: Negative for cough, shortness of breath and wheezing.   Cardiovascular: Negative for chest pain, orthopnea and PND.  Gastrointestinal: Negative for nausea, vomiting, abdominal pain and diarrhea.  Genitourinary: Negative for dysuria and hematuria.  Musculoskeletal: Positive for joint pain (right hip pain) and falls.  Neurological: Negative for dizziness, sensory change and focal weakness.  All other systems reviewed and are negative.   Nutrition: Currently nothing by mouth Tolerating Diet: No Tolerating PT: Await evaluation after surgery today   DRUG ALLERGIES:   Allergies  Allergen Reactions  . Ciprofloxacin Nausea Only  . Erythromycin Nausea Only    VITALS:  Blood pressure 143/78, pulse 67, temperature 98.3 F (36.8 C), temperature source Oral, resp. rate 18, height 5\' 7"  (1.702 m), weight 62.46 kg (137 lb 11.2 oz), SpO2 98 %.  PHYSICAL EXAMINATION:   Physical Exam  GENERAL:  81 y.o.-year-old patient lying in the bed with no acute distress.  EYES: Pupils equal, round, reactive to light and accommodation. No scleral icterus. Extraocular muscles intact.  HEENT: Head atraumatic, normocephalic. Oropharynx and nasopharynx clear.  NECK:  Supple, no jugular venous distention. No thyroid enlargement, no tenderness.  LUNGS: Normal breath sounds bilaterally, no wheezing, rales,  rhonchi. No use of accessory muscles of respiration.  CARDIOVASCULAR: S1, S2 normal. No murmurs, rubs, or gallops.  ABDOMEN: Soft, nontender, nondistended. Bowel sounds present. No organomegaly or mass.  EXTREMITIES: No cyanosis, clubbing or edema b/l.  Right hip is externally rotated and shortened due to the hip fracture.  NEUROLOGIC: Cranial nerves II through XII are intact. No focal Motor or sensory deficits b/l.   PSYCHIATRIC: The patient is alert and oriented x 3. Good affect SKIN: No obvious rash, lesion, or ulcer.    LABORATORY PANEL:   CBC  Recent Labs Lab 06/13/15 0458  WBC 12.7*  HGB 10.0*  HCT 32.0*  PLT 232   ------------------------------------------------------------------------------------------------------------------  Chemistries   Recent Labs Lab 06/13/15 0458  NA 141  K 4.6  CL 117*  CO2 19*  GLUCOSE 115*  BUN 49*  CREATININE 3.80*  CALCIUM 8.6*   ------------------------------------------------------------------------------------------------------------------  Cardiac Enzymes  Recent Labs Lab 06/12/15 1815  TROPONINI <0.03   ------------------------------------------------------------------------------------------------------------------  RADIOLOGY:  Dg Chest 1 View  06/12/2015   CLINICAL DATA:  Fall at nursing home, now with right hip pain.  EXAM: CHEST  1 VIEW  COMPARISON:  None.  FINDINGS: Heart at the upper limits of normal in size with tortuous thoracic aorta. Lungs are hyperinflated with mild coarsened interstitial markings. Vague opacity projecting over the right lung apex, anterior first rib. No consolidation, pulmonary edema or pneumothorax. Blunting of both costophrenic angles favored to reflect hyperinflation versus pleural effusions. No acute osseous abnormalities are seen. Mild scoliosis of the thoracic spine.  IMPRESSION: 1. Mild hyperinflation and coarse interstitial markings, suggestive of emphysema. 2. Vague  opacity projecting  over the right lung apex, first anterior right rib. This may reflect confluent overlapping structures, however underlying pulmonary nodule is not excluded. Chest CT recommended for further characterization.   Electronically Signed   By: Jeb Levering M.D.   On: 06/12/2015 19:05   Ct Head Wo Contrast  06/12/2015   CLINICAL DATA:  Fall, right head injury  EXAM: CT HEAD WITHOUT CONTRAST  TECHNIQUE: Contiguous axial images were obtained from the base of the skull through the vertex without intravenous contrast.  COMPARISON:  None.  FINDINGS: No evidence of parenchymal hemorrhage or extra-axial fluid collection. No mass lesion, mass effect, or midline shift.  No CT evidence of acute infarction.  Subcortical white matter and periventricular small vessel ischemic changes. Intracranial atherosclerosis.  Global cortical atrophy.  Secondary ventricular prominence.  The visualized paranasal sinuses are essentially clear. The mastoid air cells are unopacified.  No evidence of calvarial fracture.  IMPRESSION: No evidence of acute intracranial abnormality.  Atrophy with small vessel ischemic changes.   Electronically Signed   By: Julian Hy M.D.   On: 06/12/2015 18:26   Dg Knee Complete 4 Views Right  06/12/2015   CLINICAL DATA:  Fall.  Trauma to the RIGHT knee.  Knee pain.  EXAM: RIGHT KNEE - COMPLETE 4+ VIEW  COMPARISON:  None.  FINDINGS: Mild medial compartment osteoarthritis with tiny marginal osteophytes. The medial and lateral compartment joint spaces are preserved. Tibial plateau and fibula intact. Femur intact. Known knee effusion. Soft tissues appear within normal limits. Benign calcified chondroid lesion in the fibular neck. Atherosclerotic calcification in the superficial femoral and popliteal arteries.  IMPRESSION: Mild medial compartment osteoarthritis. No acute osseous abnormality.   Electronically Signed   By: Dereck Ligas M.D.   On: 06/12/2015 19:04   Dg Hip Unilat With Pelvis 2-3 Views  Right  06/12/2015   CLINICAL DATA:  Fall today.  RIGHT hip pain.  EXAM: DG HIP (WITH OR WITHOUT PELVIS) 2-3V RIGHT  COMPARISON:  None.  FINDINGS: Cervical RIGHT femoral neck fracture is present. Mild shortening of the proximal RIGHT femur. Surgical clips are present in the pelvis, likely for prostatectomy and lymphadenectomy. The LEFT hip appears normal. Atherosclerosis. Sacral arcades grossly appear intact. Obturator rings intact.  IMPRESSION: Cervical RIGHT femoral neck fracture.   Electronically Signed   By: Dereck Ligas M.D.   On: 06/12/2015 19:05     ASSESSMENT AND PLAN:   81 year old male with history of hypertension, COPD, hyperlipidemia, chronic anemia who presented to the hospital after a mechanical fall and noted to have a right hip fracture.  #1 right hip fracture-this is likely secondary to mechanical fall. -Patient is likely a low risk for noncardiac surgery. No absolute contraindication surgery at this time. -Continue care as per orthopedics and patient is going to the OR later today  #2 COPD-no acute exacerbation. -Continue Symbicort, Spiriva.  #3 hypertension-hemodynamically stable. Continue Norvasc, metoprolol, Irbesertan.  #4 depression-continue Prozac.   #5 hyperlipidemia-continue simvastatin     All the records are reviewed and case discussed with Care Management/Social Workerr. Management plans discussed with the patient, family and they are in agreement.  CODE STATUS: Full  DVT Prophylaxis: We'll start after surgery  TOTAL TIME TAKING CARE OF THIS PATIENT: 30 minutes.   POSSIBLE D/C IN 2-3 DAYS, DEPENDING ON CLINICAL CONDITION.   Henreitta Leber M.D on 06/13/2015 at 2:12 PM  Between 7am to 6pm - Pager - [phone]  After 6pm go to [email or web address] - [place]  [facility]  Office  [phone]  CC: Primary care physician; Leeroy Cha
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
## 2015-06-13 NOTE — Anesthesia Preprocedure Evaluation (Addendum)
Anesthesia Evaluation  Patient identified by MRN, date of birth, ID band Patient awake    Reviewed: Allergy & Precautions, H&P , NPO status , Patient's Chart, lab work & pertinent test results, reviewed documented beta blocker date and time   Airway Mallampati: II  TM Distance: >3 FB Neck ROM: full    Dental no notable dental hx. (+) Poor Dentition   Pulmonary neg pulmonary ROS, COPDformer smoker,  breath sounds clear to auscultation  Pulmonary exam normal       Cardiovascular Exercise Tolerance: Good hypertension, negative cardio ROS  Rhythm:regular Rate:Normal     Neuro/Psych negative neurological ROS  negative psych ROS   GI/Hepatic negative GI ROS, Neg liver ROS,   Endo/Other  negative endocrine ROS  Renal/GU negative Renal ROS  negative genitourinary   Musculoskeletal   Abdominal   Peds  Hematology negative hematology ROS (+) anemia ,   Anesthesia Other Findings   Reproductive/Obstetrics negative OB ROS                            Anesthesia Physical Anesthesia Plan  ASA: III and emergent  Anesthesia Plan: Spinal   Post-op Pain Management:    Induction:   Airway Management Planned:   Additional Equipment:   Intra-op Plan:   Post-operative Plan:   Informed Consent: I have reviewed the patients History and Physical, chart, labs and discussed the procedure including the risks, benefits and alternatives for the proposed anesthesia with the patient or authorized representative who has indicated his/her understanding and acceptance.   Dental Advisory Given  Plan Discussed with: CRNA  Anesthesia Plan Comments:        Anesthesia Quick Evaluation

## 2015-06-13 NOTE — Clinical Social Work Note (Signed)
Clinical Social Work Assessment  Patient Details  Name: Chad Simpson MRN: [id number] Date of Birth: 1930-01-04  Date of referral:  06/13/15               Reason for consult:  Facility Placement, Other (Comment Required) (From [facility] )                Permission sought to share information with:  Chartered certified accountant granted to share information::  Yes, Verbal Permission Granted  Name::      [place]::   [place]   Relationship::     Contact Information:     Housing/Transportation Living arrangements for the past 2 months:  [place] of Information:  Patient Patient Interpreter Needed:  None Criminal Activity/Legal Involvement Pertinent to Current Situation/Hospitalization:  No - Comment as needed Significant Relationships:  Spouse, Adult Children Lives with:  Spouse Do you feel safe going back to the place where you live?  Yes Need for family participation in patient care:  Yes (Comment)  Care giving concerns:  Patient lives at [place] with his wife [name].    Social Worker assessment / plan: Patient is having surgery today. Clinical Social Worker (CSW) met with patient prior to surgery to discuss D/C plan. CSW introduced self and explained role of CSW department. Patient reported that he lives at [facility] independent living with his wife [name]. Patient reported that him and his wife have 2 children that lives close by. CSW explained that PT will work with patient the day after surgery and will make a recommendation of home health or SNF. CSW explained SNF process. Patient is open to home health or SNF depending on the PT recommendation.  FL2 complete and on chart.   Employment status:  Retired Forensic scientist:  Medicare PT Recommendations:  Not assessed at this time [place] / Referral to community resources:  [place]  Patient/Family's  Response to care: Patient is open to home heatlh or SNF. PT will evaluate patient after surgery.   Patient/Family's Understanding of and Emotional Response to Diagnosis, Current Treatment, and Prognosis: Patient was pleasant and alert and oriented throughout assessment.   Emotional Assessment Appearance:    Attitude/Demeanor/Rapport:    Affect (typically observed):  Accepting, Pleasant Orientation:  Oriented to Self, Oriented to Place, Oriented to  Time, Oriented to Situation Alcohol / Substance use:  Not Applicable Psych involvement (Current and /or in the community):  No (Comment)  Discharge Needs  Concerns to be addressed:  Discharge Planning Concerns Readmission within the last 30 days:  No Current discharge risk:  Chronically ill Barriers to Discharge:  Continued Medical Work up   Loralyn Freshwater, LCSW 06/13/2015, 11:48 AM

## 2015-06-14 ENCOUNTER — Inpatient Hospital Stay: Payer: Medicare Other

## 2015-06-14 ENCOUNTER — Encounter: Payer: Self-pay | Admitting: Orthopedic Surgery

## 2015-06-14 LAB — BASIC METABOLIC PANEL
Anion gap: 6 (ref 5–15)
BUN: 43 mg/dL — ABNORMAL HIGH (ref 6–20)
CO2: 17 mmol/L — AB (ref 22–32)
Calcium: 8.2 mg/dL — ABNORMAL LOW (ref 8.9–10.3)
Chloride: 117 mmol/L — ABNORMAL HIGH (ref 101–111)
Creatinine, Ser: 3.16 mg/dL — ABNORMAL HIGH (ref 0.61–1.24)
GFR calc non Af Amer: 17 mL/min — ABNORMAL LOW (ref >60)
GFR, EST AFRICAN AMERICAN: 19 mL/min — AB (ref >60)
GLUCOSE: 125 mg/dL — AB (ref 65–99)
POTASSIUM: 4.5 mmol/L (ref 3.5–5.1)
Sodium: 140 mmol/L (ref 135–145)

## 2015-06-14 LAB — CBC
HCT: 29.4 % — ABNORMAL LOW (ref 40.0–52.0)
Hemoglobin: 9.1 g/dL — ABNORMAL LOW (ref 13.0–18.0)
MCH: 30.7 pg (ref 26.0–34.0)
MCHC: 31.1 g/dL — AB (ref 32.0–36.0)
MCV: 99 fL (ref 80.0–100.0)
Platelets: 189 10*3/uL (ref 150–440)
RBC: 2.97 MIL/uL — AB (ref 4.40–5.90)
RDW: 20.2 % — ABNORMAL HIGH (ref 11.5–14.5)
WBC: 16 10*3/uL — ABNORMAL HIGH (ref 3.8–10.6)

## 2015-06-14 MED ORDER — ENOXAPARIN SODIUM 30 MG/0.3ML ~~LOC~~ SOLN
30.0000 mg | SUBCUTANEOUS | Status: DC
  Administered 2015-06-15: 30 mg via SUBCUTANEOUS
  Filled 2015-06-14: qty 0.3

## 2015-06-14 MED ORDER — NEOMYCIN-POLYMYXIN B GU 40-200000 IR SOLN
Status: DC | PRN
  Administered 2015-06-14: 12 mL

## 2015-06-14 MED ORDER — ACETAMINOPHEN 650 MG RE SUPP: 650.0000 mg | Freq: Four times a day (QID) | RECTAL | Status: DC | PRN

## 2015-06-14 MED ORDER — FENTANYL CITRATE (PF) 100 MCG/2ML IJ SOLN
INTRAMUSCULAR | Status: AC
  Filled 2015-06-14: qty 2

## 2015-06-14 MED ORDER — ONDANSETRON HCL 4 MG/2ML IJ SOLN: 4.0000 mg | Freq: Once | INTRAMUSCULAR | Status: AC | PRN

## 2015-06-14 MED ORDER — MAGNESIUM HYDROXIDE 400 MG/5ML PO SUSP
30.0000 mL | Freq: Every day | ORAL | Status: DC | PRN
  Administered 2015-06-15: 30 mL via ORAL
  Filled 2015-06-14: qty 30

## 2015-06-14 MED ORDER — TRAMADOL HCL 50 MG PO TABS: 50.0000 mg | ORAL_TABLET | ORAL | Status: DC | PRN

## 2015-06-14 MED ORDER — ACETAMINOPHEN 10 MG/ML IV SOLN
INTRAVENOUS | Status: AC
  Administered 2015-06-14: 1000 mg via INTRAVENOUS
  Filled 2015-06-14: qty 100

## 2015-06-14 MED ORDER — PHENOL 1.4 % MT LIQD
1.0000 | OROMUCOSAL | Status: DC | PRN
  Filled 2015-06-14: qty 177

## 2015-06-14 MED ORDER — SODIUM CHLORIDE 0.9 % IV SOLN
INTRAVENOUS | Status: DC
  Administered 2015-06-14 – 2015-06-15 (×4): via INTRAVENOUS

## 2015-06-14 MED ORDER — FLEET ENEMA 7-19 GM/118ML RE ENEM: 1.0000 | ENEMA | Freq: Once | RECTAL | Status: AC | PRN

## 2015-06-14 MED ORDER — ONDANSETRON HCL 4 MG/2ML IJ SOLN: 4.0000 mg | Freq: Four times a day (QID) | INTRAMUSCULAR | Status: DC | PRN

## 2015-06-14 MED ORDER — OXYCODONE HCL 5 MG PO TABS
5.0000 mg | ORAL_TABLET | ORAL | Status: DC | PRN
  Administered 2015-06-14 – 2015-06-15 (×3): 5 mg via ORAL
  Filled 2015-06-14 (×4): qty 1

## 2015-06-14 MED ORDER — MENTHOL 3 MG MT LOZG
1.0000 | LOZENGE | OROMUCOSAL | Status: DC | PRN
  Filled 2015-06-14: qty 9

## 2015-06-14 MED ORDER — CEFAZOLIN SODIUM-DEXTROSE 2-3 GM-% IV SOLR
2.0000 g | Freq: Four times a day (QID) | INTRAVENOUS | Status: AC
  Administered 2015-06-14 (×4): 2 g via INTRAVENOUS
  Filled 2015-06-14 (×4): qty 50

## 2015-06-14 MED ORDER — MORPHINE SULFATE 2 MG/ML IJ SOLN
2.0000 mg | INTRAMUSCULAR | Status: DC | PRN
  Administered 2015-06-16: 2 mg via INTRAVENOUS
  Filled 2015-06-14: qty 1

## 2015-06-14 MED ORDER — METOCLOPRAMIDE HCL 10 MG PO TABS
10.0000 mg | ORAL_TABLET | Freq: Three times a day (TID) | ORAL | Status: AC
  Administered 2015-06-14 – 2015-06-15 (×7): 10 mg via ORAL
  Filled 2015-06-14 (×9): qty 1

## 2015-06-14 MED ORDER — FENTANYL CITRATE (PF) 100 MCG/2ML IJ SOLN
25.0000 ug | INTRAMUSCULAR | Status: DC | PRN
  Administered 2015-06-14 (×4): 25 ug via INTRAVENOUS

## 2015-06-14 MED ORDER — ACETAMINOPHEN 325 MG PO TABS
650.0000 mg | ORAL_TABLET | Freq: Four times a day (QID) | ORAL | Status: DC | PRN
  Administered 2015-06-16: 650 mg via ORAL
  Filled 2015-06-14: qty 2

## 2015-06-14 MED ORDER — ONDANSETRON HCL 4 MG PO TABS: 4.0000 mg | ORAL_TABLET | Freq: Four times a day (QID) | ORAL | Status: DC | PRN

## 2015-06-14 MED ORDER — ACETAMINOPHEN 10 MG/ML IV SOLN
1000.0000 mg | Freq: Four times a day (QID) | INTRAVENOUS | Status: AC
  Administered 2015-06-14 (×3): 1000 mg via INTRAVENOUS
  Filled 2015-06-14 (×4): qty 100

## 2015-06-14 MED ORDER — BISACODYL 10 MG RE SUPP: 10.0000 mg | Freq: Every day | RECTAL | Status: DC | PRN

## 2015-06-14 MED ORDER — SENNOSIDES-DOCUSATE SODIUM 8.6-50 MG PO TABS
1.0000 | ORAL_TABLET | Freq: Two times a day (BID) | ORAL | Status: DC
  Administered 2015-06-14 – 2015-06-16 (×5): 1 via ORAL
  Filled 2015-06-14 (×5): qty 1

## 2015-06-14 NOTE — Care Management Important Message (Signed)
Important Message  Patient Details  Name: Chad Simpson MRN: [id number] Date of Birth: [birth year]   Medicare Important Message Given:  Yes-second notification given    Darius Bump Allmond 06/14/2015, 9:34 AM

## 2015-06-14 NOTE — Progress Notes (Signed)
Physical Therapy Treatment Patient Details Name: Chad Simpson MRN: [id number] DOB: Mar 26, 1930 Today's Date: 06/14/2015    History of Present Illness Pt suferred a mechanical fall with a R hip fracture. He underwent R hip hemiarthroplasty and is POD#1 at time of evaluation. Orthopedic surgeon contacted who confirmed posterior precautions and start of therapy on 06/14/15 AM and PM.    PT Comments    Pt demonstrates improvement in strength and sequencing during mobility this afternoon. He is still very unsafe with gait due to poor balance and safety awareness. Increased pain reported during exercises this afternoon. Pt with poor recall of hip precautions and is only able to retain 1/3 precautions from AM session. Pt will benefit from skilled PT services to address deficits in strength, balance, and mobility in order to return to full function at home.    Follow Up Recommendations  SNF     Equipment Recommendations  None recommended by PT    Recommendations for Other Services       Precautions / Restrictions Precautions Precautions: Posterior Hip;Fall Precaution Booklet Issued: Yes (comment) Restrictions Weight Bearing Restrictions: Yes RLE Weight Bearing: Weight bearing as tolerated    Mobility  Bed Mobility Overal bed mobility: Needs Assistance Bed Mobility: Sit to Supine       Sit to supine: Mod assist   General bed mobility comments: Assist to keep RLE out in front of body to avoid adduction during sit to supine to the R  Transfers Overall transfer level: Needs assistance Equipment used: Rolling walker (2 wheeled) Transfers: Sit to/from Stand Sit to Stand: Mod assist;+2 physical assistance         General transfer comment: Improved sequencing noted but pt still requires cues to avoid violating hip precautions. Decreased weight acceptance to RLE  Ambulation/Gait Ambulation/Gait assistance: +2 physical assistance;Mod assist;+2 safety/equipment Ambulation Distance  (Feet): 5 Feet Assistive device: Rolling walker (2 wheeled) Gait Pattern/deviations: Decreased step length - right;Decreased step length - left;Decreased weight shift to right   Gait velocity interpretation: <1.8 ft/sec, indicative of risk for recurrent falls General Gait Details: Pt with shuffling steps and poor safety awareness. Posterior leaning requiring assist to prevent falling backwards. Cues for safety and sequencing   Stairs            Wheelchair Mobility    Modified Rankin (Stroke Patients Only)       Balance Overall balance assessment: Needs assistance   Sitting balance-Leahy Scale: Fair       Standing balance-Leahy Scale: Poor                      Cognition Arousal/Alertness: Awake/alert Behavior During Therapy: WFL for tasks assessed/performed   Area of Impairment: Memory     Memory: Decreased recall of precautions (named 1 of 3 hip precautions)              Exercises Total Joint Exercises Towel Squeeze: Strengthening;Both;10 reps;Seated Heel Slides: Strengthening;Both;10 reps;Seated Hip ABduction/ADduction: Strengthening;Both;10 reps;Seated Straight Leg Raises: Strengthening;Both;10 reps;Supine Long Arc Quad: Strengthening;Both;10 reps;Seated Marching in Standing: Strengthening;Both;10 reps;Seated    General Comments        Pertinent Vitals/Pain Pain Assessment: 0-10 Pain Score: 1  Pain Location: R hip Pain Intervention(s): Monitored during session    Home Living                      Prior Function            PT Goals (current goals can  now be found in the care plan section) Acute Rehab PT Goals Patient Stated Goal: "I know I will need to go to rehab" PT Goal Formulation: With patient Time For Goal Achievement: 06/28/15 Potential to Achieve Goals: Good Progress towards PT goals: Progressing toward goals    Frequency  BID    PT Plan Current plan remains appropriate    Co-evaluation              End of Session Equipment Utilized During Treatment: Gait belt Activity Tolerance: Patient limited by pain Patient left: with call bell/phone within reach;with SCD's reapplied;in bed;with bed alarm set (pillow between knees for abduction)     Time: 5732-2025 PT Time Calculation (min) (ACUTE ONLY): 28 min  Charges:  $Gait Training: 8-22 mins $Therapeutic Exercise: 8-22 mins                    G Codes:      Lyndel Safe Huprich PT, DPT   Huprich,Jason 06/14/2015, 4:13 PM

## 2015-06-14 NOTE — Op Note (Signed)
OPERATIVE NOTE  DATE OF SURGERY:  06/12/2015 - 06/14/2015  PATIENT NAME:  Chad Simpson   DOB: 18-Oct-1930  MRN: [id number]  PRE-OPERATIVE DIAGNOSIS: Right femoral neck fracture  POST-OPERATIVE DIAGNOSIS:  Same  PROCEDURE:  Right hip hemiarthroplasty  SURGEON:  Marciano Sequin. M.D.  ANESTHESIA: spinal  ESTIMATED BLOOD LOSS: 150 mL  FLUIDS REPLACED: 1100 mL of crystalloid  DRAINS: 2 medium drains to a Hemovac reservoir  IMPLANTS UTILIZED: DePuy size 4 Summit femoral stem (cemented), 11 mm Cementralizer, 49 mm OD Cathcart hip ball, -3 mm tapered spacer, and a size 3 femoral cement restrictor  INDICATIONS FOR SURGERY: Chad Simpson is a 81 y.o. year old male who fell and sustained a displaced right femoral neck fracture. After discussion of the risks and benefits of surgical intervention, the patient expressed understanding of the risks benefits and agree with plans for hip hemiarthroplasty.   The risks, benefits, and alternatives were discussed at length including but not limited to the risks of infection, bleeding, nerve injury, stiffness, blood clots, the need for revision surgery, limb length inequality, dislocation, cardiopulmonary complications, among others, and they were willing to proceed.  PROCEDURE IN DETAIL: The patient was brought into the operating room and, after adequate spinal anesthesia was achieved, patient was placed in a left lateral decubitus position. Axillary roll was placed and all bony prominences were well-padded. The patient's right hip was cleaned and prepped with alcohol and DuraPrep and draped in the usual sterile fashion. A "timeout" was performed as per usual protocol. A lateral curvilinear incision was made gently curving towards the posterior superior iliac spine. The IT band was incised in line with the skin incision and the fibers of the gluteus maximus were split in line. The piriformis tendon was identified, skeletonized, and incised at its insertion  to the proximal femur and reflected posteriorly. A T type posterior capsulotomy was performed. The femoral head was then removed using a corkscrew device. The femoral head was measured using calipers and ring gauges and determined to be 49 mm in diameter.The femoral neck cut was performed using an oscillating saw. The acetabulum was inspected for any bony fragments. The articular surface was in good condition.  Attention was then directed to the proximal femur. A pilot hole for preparation of the proximal femoral canal was created using a high-speed bur. The femoral canal finder was inserted followed by insertion of the conical reamer. Serial broaches were inserted up to a size 4 broach. Calcar region was planed and a trial reduction was performed using a 49 mm OD Cathcart ball with a -3 mm neck length. Good equalization of limb lengths was appreciated and excellent stability was noted both anteriorly and posteriorly. Trial components were removed. The femoral canal was sized and was felt that a size 3 cement restrictor was appropriate. The cement restrictor was inserted to the appropriate depth in the femoral canal was irrigated with copious amounts of fluid using the pulse lavage and suctioned dry. The femoral canal was then packed with vaginal packing soaked in dilute Neo-Synephrine. Polymethylmethacrylate cement was per usual fashion using a vacuum mixer. Vaginal packing was removed and the canal again irrigated and suctioned dry. The polymethylmethacrylate cement was inserted in retrograde fashion and pressurized. The size 4 Summit femoral component with a 11 mm Cementralizer was positioned and impacted into place. Excess cement was removed using [employer] fast streamer. After adequate curing of the cement, the Morse taper was cleaned and dried. A 49 mm outer diameter Cathcart  hip ball with a -3 mm tapered spacer was placed on the trunnion and impacted into place. The acetabulum was again irrigated and suctioned dry,  making sure to inspect for any residal bony debris. The femoral head was then reduced and placed through a range of motion. Excellent stability was noted both anteriorly and posteriorly. Good equalization of limb lengths was appreciated.   The wound was irrigated with copious amounts of normal saline with antibiotic solution and suctioned dry. Good hemostasis was appreciated. The posterior capsulotomy was repaired using #5 Ethibond. Piriformis tendon was reapproximated to the undersurface of the gluteus medius tendon using #5 Ethibond. Two medium drains were placed in the wound bed and brought out through separate stab incisions to be attached to a Hemovac reservoir. The IT band was reapproximated using interrupted sutures of #1 Vicryl. Subcutaneous tissue was proximal phalanx using first #0 Vicryl followed by #2-0 Vicryl. The skin was closed with skin staples.  The patient tolerated the procedure well and was transported to the recovery room in stable condition.   Marciano Sequin., M.D.

## 2015-06-14 NOTE — Clinical Social Work Placement (Signed)
   CLINICAL SOCIAL WORK PLACEMENT  NOTE  Date:  06/14/2015  Patient Details  Name: [name] MRN: [id number] Date of Birth: Sep 12, 1930  Clinical Social Work is seeking post-discharge placement for this patient at the [place] level of care (*CSW will initial, date and re-position this form in  chart as items are completed):  Yes   Patient/family provided with [place] Work Department's list of facilities offering this level of care within the geographic area requested by the patient (or if unable, by the patient's family).  Yes   Patient/family informed of their freedom to choose among providers that offer the needed level of care, that participate in Medicare, Medicaid or managed care program needed by the patient, have an available bed and are willing to accept the patient.  Yes   Patient/family informed of [employer]'s ownership interest in [facility] and [facility], as well as of the fact that they are under no obligation to receive care at these facilities.  PASRR submitted to EDS on 06/13/15     PASRR number received on 06/13/15     Existing PASRR number confirmed on       FL2 transmitted to all facilities in geographic area requested by pt/family on 06/14/15     FL2 transmitted to all facilities within larger geographic area on       Patient informed that his/her managed care company has contracts with or will negotiate with certain facilities, including the following:        Yes   Patient/family informed of bed offers received.  Patient chooses bed at  [facility] )     Physician recommends and patient chooses bed at      Patient to be transferred to   on  .  Patient to be transferred to facility by       Patient family notified on   of transfer.  Name of family member notified:        PHYSICIAN       Additional Comment:    _______________________________________________ Loralyn Freshwater, LCSW 06/14/2015, 3:47  PM

## 2015-06-14 NOTE — Brief Op Note (Signed)
06/12/2015 - 06/14/2015  12:08 AM  PATIENT:  Norlene Duel  81 y.o. male  PRE-OPERATIVE DIAGNOSIS:  right femoral neck fracture  POST-OPERATIVE DIAGNOSIS:  right femoral neck fracture  PROCEDURE:   Right hip hemiarthroplasty  SURGEON:  Surgeon(s) and Role:    * Dereck Leep, MD - Primary  ASSISTANTS: none   ANESTHESIA:   spinal  EBL:  Total I/O In: 1100 [I.V.:1100] Out: 500 [Urine:350; Blood:150]  BLOOD ADMINISTERED:none  DRAINS: 2 medium hemovac   LOCAL MEDICATIONS USED:  NONE  SPECIMEN:  Source of Specimen:  Right femoral head  DISPOSITION OF SPECIMEN:  PATHOLOGY  COUNTS:  YES  TOURNIQUET:  * No tourniquets in log *  DICTATION: .Dragon Dictation  PLAN OF CARE: Admit to inpatient   PATIENT DISPOSITION:  PACU - hemodynamically stable.   Delay start of Pharmacological VTE agent (>24hrs) due to surgical blood loss or risk of bleeding: yes

## 2015-06-14 NOTE — Progress Notes (Signed)
Notified Dr Marry Guan that pt pulled out hemovac  No new orders

## 2015-06-14 NOTE — Progress Notes (Signed)
   Subjective: 1 Day Post-Op Procedure(s) (LRB): ARTHROPLASTY BIPOLAR HIP (HEMIARTHROPLASTY) (Right) Patient reports pain as 1 on 0-10 scale.   Patient is well, and has had no acute complaints or problems We will start therapy today.  Plan is to go Rehab after hospital stay.  Objective: Vital signs in last 24 hours: Temp:  [96.9 F (36.1 C)-98.6 F (37 C)] 97.7 F (36.5 C) (08/02 0639) Pulse Rate:  [31-82] 66 (08/02 0639) Resp:  [9-30] 18 (08/02 0639) BP: (143-199)/(51-169) 178/59 mmHg (08/02 0639) SpO2:  [91 %-100 %] 99 % (08/02 0639) FiO2 (%):  [28 %] 28 % (08/02 0147)  Intake/Output from previous day: 08/01 0701 - 08/02 0700 In: 3105 [P.O.:720; I.V.:2385] Out: 2200 [Urine:1975; Drains:50; Blood:150] Intake/Output this shift: Total I/O In: 1960 [P.O.:360; I.V.:1600] Out: 900 [Urine:675; Drains:50; Other:25; Blood:150]   Recent Labs  06/12/15 1815 06/13/15 0458 06/14/15 0445  HGB 11.0* 10.0* 9.1*    Recent Labs  06/13/15 0458 06/14/15 0445  WBC 12.7* 16.0*  RBC 3.23* 2.97*  HCT 32.0* 29.4*  PLT 232 189    Recent Labs  06/13/15 0458 06/14/15 0445  NA 141 140  K 4.6 4.5  CL 117* 117*  CO2 19* 17*  BUN 49* 43*  CREATININE 3.80* 3.16*  GLUCOSE 115* 125*  CALCIUM 8.6* 8.2*    Recent Labs  06/12/15 1815  INR 1.00    EXAM General - Patient is Alert, Appropriate and Oriented Extremity - Neurovascular intact Sensation intact distally Intact pulses distally Dorsiflexion/Plantar flexion intact No cellulitis present Dressing - dressing C/D/I and no drainage Motor Function - intact, moving foot and toes well on exam.   Past Medical History  Diagnosis Date  . Anemia 04/27/2015  . Cancer   . Hypertension   . COPD (chronic obstructive pulmonary disease)   . High cholesterol     Assessment/Plan:   1 Day Post-Op Procedure(s) (LRB): ARTHROPLASTY BIPOLAR HIP (HEMIARTHROPLASTY) (Right) Principal Problem:   Femoral neck fracture   Acute post op  blood loss anemia   Estimated body mass index is 21.56 kg/(m^2) as calculated from the following:   Height as of this encounter: 5\' 7"  (1.702 m).   Weight as of this encounter: 62.46 kg (137 lb 11.2 oz). Advance diet Up with therapy  Recheck labs in the am Needs BM  DVT Prophylaxis - Lovenox, Foot Pumps and TED hose Weight-Bearing as tolerated to right leg D/C O2 and Pulse OX and try on Room Air  T. Rachelle Hora, PA-C [place] 06/14/2015, 6:41 AM

## 2015-06-14 NOTE — Evaluation (Signed)
Occupational Therapy Evaluation Patient Details Name: Chad Simpson MRN: [id number] DOB: 10/08/1930 Today's Date: 06/14/2015    History of Present Illness Pt suferred a mechanical fall with a R hip fracture. He underwent R hip hemiarthroplasty.    Clinical Impression   This patient is an 81 year old male who came to [facility] after a fall in an independent living apartment.  He suffered a R hip fracture and received a hemiarthroplasty repair. He had been independent with basic activities of daily living and functional mobility using a walker. He stopped driving a few months ago.  He now needs much assist and would benefit from Occupational Therapy for ADL/functional mobility training while .staying within hip precautions (posterior approach)       Follow Up Recommendations  SNF    Equipment Recommendations    Hip kit   Recommendations for Other Services       Precautions / Restrictions Precautions Precautions: Posterior Hip;Fall Precaution Booklet Issued: Yes (comment) Restrictions Weight Bearing Restrictions: Yes RLE Weight Bearing: Weight bearing as tolerated      Mobility Bed Mobility  Transfers Overall transfer level: Needs assistance Equipment used: Rolling walker (2 wheeled) Transfers: Sit to/from Stand Sit to Stand: Mod assist;+2 physical assistance            Balance                               ADL                                         General ADL Comments: Had been independent with basic ADL and functional motility using walker. He now needs assist. He practiced lower body dressing using hip kit for Donned/doffed socks and pants to knees needing hand over hand assist and verbal cues for technique and to stay within hip precautions (posterior approach)      Vision     Perception     Praxis      Pertinent Vitals/Pain Pain Assessment: 0-10 Pain Score: 1      Hand Dominance      Extremity/Trunk Assessment Upper Extremity Assessment Upper Extremity Assessment: Generalized weakness          Communication Communication Communication: HOH   Cognition Arousal/Alertness: Awake/alert Behavior During Therapy: WFL for tasks assessed/performed Overall Cognitive Status:  (Patient named he is in a hospital, that  he broke his hip, and he knew the year. Named 2 of his hip precautions.) Area of Impairment: Memory     Memory:  (named 2 of 3 hip precautions)           General Comments       Exercises     Shoulder Instructions      Home Living Family/patient expects to be discharged to:: Private residence (Independent living facility) Living Arrangements: Spouse/significant other Available Help at Discharge: Family Type of Home: Apartment Home Access: Level entry     Home Layout:  (with elevator access.) one floor appartment             Home Equipment: Walker - 2 wheels          Prior Functioning/Environment Level of Independence: Independent with assistive device(s)        Comments: quit driving recently    OT Diagnosis: Generalized weakness   OT Problem  List: Decreased strength;Decreased activity tolerance;Impaired balance (sitting and/or standing);Decreased cognition   OT Treatment/Interventions:      OT Goals(Current goals can be found in the care plan section) Acute Rehab OT Goals Patient Stated Goal: Conferms he will need rehab OT Goal Formulation: With patient Time For Goal Achievement: 06/28/15 Potential to Achieve Goals: Good  OT Frequency:     Barriers to D/C:            Co-evaluation              End of Session Equipment Utilized During Treatment:  (hip kit)  Activity Tolerance:   Patient left: in chair;with call bell/phone within reach;with chair alarm set   Time: 1135-1155 OT Time Calculation (min): 20 min Charges:  OT General Charges $OT Visit: 1 Procedure OT Evaluation $Initial OT Evaluation Tier I:  1 Procedure OT Treatments $Self Care/Home Management : 8-22 mins G-Codes:    Myrene Galas, MS/OTR/L  06/14/2015, 12:08 PM

## 2015-06-14 NOTE — Transfer of Care (Signed)
Immediate Anesthesia Transfer of Care Note  Patient: Chad Simpson  Procedure(s) Performed: Procedure(s): ARTHROPLASTY BIPOLAR HIP (HEMIARTHROPLASTY) (Right)  Patient Location: PACU  Anesthesia Type:Spinal  Level of Consciousness: sedated and patient cooperative  Airway & Oxygen Therapy: Patient Spontanous Breathing and Patient connected to face mask oxygen  Post-op Assessment: Report given to RN and Post -op Vital signs reviewed and stable  Post vital signs: Reviewed and stable  Last Vitals:  Filed Vitals:   06/14/15 0015  BP:   Pulse: 72  Temp:   Resp: 14    Complications: No apparent anesthesia complications

## 2015-06-14 NOTE — Progress Notes (Signed)
Clinical Education officer, museum (CSW) presented bed offers to patient. He gave CSW permission to contact his daughter Colletta [place] [phone]. Daughter chose [facility]. CSW contacted [facility] admissions coordinator at [facility] and made her aware of above. Plan is for patient to D/C to [facility] Thursday 06/16/15. CSW will continue to follow and assist as needed.   Blima Rich, [place] [phone]

## 2015-06-14 NOTE — Anesthesia Postprocedure Evaluation (Signed)
  Anesthesia Post-op Note  Patient: Chad Simpson  Procedure(s) Performed: Procedure(s): ARTHROPLASTY BIPOLAR HIP (HEMIARTHROPLASTY) (Right)  Anesthesia type:Spinal  Patient location: 144  Post pain: Pain level controlled  Post assessment: Post-op Vital signs reviewed, Patient's Cardiovascular Status Stable, Respiratory Function Stable, Patent Airway and No signs of Nausea or vomiting  Post vital signs: Reviewed and stable  Last Vitals:  Filed Vitals:   06/14/15 0738  BP: 177/65  Pulse: 65  Temp: 36.2 C  Resp: 18    Level of consciousness: awake, alert  and patient cooperative  Complications: No apparent anesthesia complications

## 2015-06-14 NOTE — Progress Notes (Signed)
Chad Simpson at [place] NAME: Chad Simpson    MR#:  [id number]  DATE OF BIRTH:  [birth year]  SUBJECTIVE:  CHIEF COMPLAINT:   Chief Complaint  Patient presents with  . Fall   Patient here due to mechanical fall and noted to have a right hip fracture. Patient is postop day #1 from partial right hip replacement. Seen by physical therapy today and he recommended short-term rehabilitation. Pain is a little better controlled today.   REVIEW OF SYSTEMS:    Review of Systems  Constitutional: Negative for fever and chills.  HENT: Negative for congestion and tinnitus.   Eyes: Negative for blurred vision and double vision.  Respiratory: Negative for cough, shortness of breath and wheezing.   Cardiovascular: Negative for chest pain, orthopnea and PND.  Gastrointestinal: Negative for nausea, vomiting, abdominal pain and diarrhea.  Genitourinary: Negative for dysuria and hematuria.  Musculoskeletal: Positive for joint pain (right hip pain).  Neurological: Negative for dizziness, sensory change and focal weakness.  All other systems reviewed and are negative.   Nutrition: Heart healthy Tolerating Diet: Yes Tolerating PT: Yes   DRUG ALLERGIES:   Allergies  Allergen Reactions  . Ciprofloxacin Nausea Only  . Erythromycin Nausea Only    VITALS:  Blood pressure 167/87, pulse 67, temperature 98.4 F (36.9 C), temperature source Oral, resp. rate 19, height 5\' 7"  (1.702 m), weight 62.46 kg (137 lb 11.2 oz), SpO2 95 %.  PHYSICAL EXAMINATION:   Physical Exam  GENERAL:  81 y.o.-year-old patient lying in the bed with no acute distress.  EYES: Pupils equal, round, reactive to light and accommodation. No scleral icterus. Extraocular muscles intact.  HEENT: Head atraumatic, normocephalic. Oropharynx and nasopharynx clear.  NECK:  Supple, no jugular venous distention. No thyroid enlargement, no tenderness.  LUNGS: Normal breath sounds bilaterally,  no wheezing, rales, rhonchi. No use of accessory muscles of respiration.  CARDIOVASCULAR: S1, S2 normal. No murmurs, rubs, or gallops.  ABDOMEN: Soft, nontender, nondistended. Bowel sounds present. No organomegaly or mass.  EXTREMITIES: No cyanosis, clubbing or edema b/l.  Right hip dressing from recent surgery  NEUROLOGIC: Cranial nerves II through XII are intact. No focal Motor or sensory deficits b/l.   PSYCHIATRIC: The patient is alert and oriented x 3. Good affect SKIN: No obvious rash, lesion, or ulcer.    LABORATORY PANEL:   CBC  Recent Labs Lab 06/14/15 0445  WBC 16.0*  HGB 9.1*  HCT 29.4*  PLT 189   ------------------------------------------------------------------------------------------------------------------  Chemistries   Recent Labs Lab 06/14/15 0445  NA 140  K 4.5  CL 117*  CO2 17*  GLUCOSE 125*  BUN 43*  CREATININE 3.16*  CALCIUM 8.2*   ------------------------------------------------------------------------------------------------------------------  Cardiac Enzymes  Recent Labs Lab 06/12/15 1815  TROPONINI <0.03   ------------------------------------------------------------------------------------------------------------------  RADIOLOGY:  Dg Chest 1 View  06/12/2015   CLINICAL DATA:  Fall at nursing home, now with right hip pain.  EXAM: CHEST  1 VIEW  COMPARISON:  None.  FINDINGS: Heart at the upper limits of normal in size with tortuous thoracic aorta. Lungs are hyperinflated with mild coarsened interstitial markings. Vague opacity projecting over the right lung apex, anterior first rib. No consolidation, pulmonary edema or pneumothorax. Blunting of both costophrenic angles favored to reflect hyperinflation versus pleural effusions. No acute osseous abnormalities are seen. Mild scoliosis of the thoracic spine.  IMPRESSION: 1. Mild hyperinflation and coarse interstitial markings, suggestive of emphysema. 2. Vague opacity projecting over the right  lung  apex, first anterior right rib. This may reflect confluent overlapping structures, however underlying pulmonary nodule is not excluded. Chest CT recommended for further characterization.   Electronically Signed   By: Jeb Levering M.D.   On: 06/12/2015 19:05   Ct Head Wo Contrast  06/12/2015   CLINICAL DATA:  Fall, right head injury  EXAM: CT HEAD WITHOUT CONTRAST  TECHNIQUE: Contiguous axial images were obtained from the base of the skull through the vertex without intravenous contrast.  COMPARISON:  None.  FINDINGS: No evidence of parenchymal hemorrhage or extra-axial fluid collection. No mass lesion, mass effect, or midline shift.  No CT evidence of acute infarction.  Subcortical white matter and periventricular small vessel ischemic changes. Intracranial atherosclerosis.  Global cortical atrophy.  Secondary ventricular prominence.  The visualized paranasal sinuses are essentially clear. The mastoid air cells are unopacified.  No evidence of calvarial fracture.  IMPRESSION: No evidence of acute intracranial abnormality.  Atrophy with small vessel ischemic changes.   Electronically Signed   By: Julian Hy M.D.   On: 06/12/2015 18:26   Dg Knee Complete 4 Views Right  06/12/2015   CLINICAL DATA:  Fall.  Trauma to the RIGHT knee.  Knee pain.  EXAM: RIGHT KNEE - COMPLETE 4+ VIEW  COMPARISON:  None.  FINDINGS: Mild medial compartment osteoarthritis with tiny marginal osteophytes. The medial and lateral compartment joint spaces are preserved. Tibial plateau and fibula intact. Femur intact. Known knee effusion. Soft tissues appear within normal limits. Benign calcified chondroid lesion in the fibular neck. Atherosclerotic calcification in the superficial femoral and popliteal arteries.  IMPRESSION: Mild medial compartment osteoarthritis. No acute osseous abnormality.   Electronically Signed   By: Dereck Ligas M.D.   On: 06/12/2015 19:04   Dg Hip Port Unilat With Pelvis 1v Right  06/14/2015    CLINICAL DATA:  Postoperative images.  Right hip fracture.  EXAM: DG HIP (WITH OR WITHOUT PELVIS) 1V PORT RIGHT  COMPARISON:  Radiographs 06/12/2015  FINDINGS: There is a right femoral prosthesis, appearing well aligned. Skin staples are noted. No immediate complication is evident.  IMPRESSION: Right hip prosthesis.   Electronically Signed   By: Andreas Newport M.D.   On: 06/14/2015 01:11   Dg Hip Unilat With Pelvis 2-3 Views Right  06/12/2015   CLINICAL DATA:  Fall today.  RIGHT hip pain.  EXAM: DG HIP (WITH OR WITHOUT PELVIS) 2-3V RIGHT  COMPARISON:  None.  FINDINGS: Cervical RIGHT femoral neck fracture is present. Mild shortening of the proximal RIGHT femur. Surgical clips are present in the pelvis, likely for prostatectomy and lymphadenectomy. The LEFT hip appears normal. Atherosclerosis. Sacral arcades grossly appear intact. Obturator rings intact.  IMPRESSION: Cervical RIGHT femoral neck fracture.   Electronically Signed   By: Dereck Ligas M.D.   On: 06/12/2015 19:05     ASSESSMENT AND PLAN:   81 year old male with history of hypertension, COPD, hyperlipidemia, chronic anemia who presented to the hospital after a mechanical fall and noted to have a right hip fracture.  #1 right hip fracture-this is likely secondary to mechanical fall. -Patient is status post partial right hip replacement. POD #1 -Continue pain control as per orthopedics. Seen by physical therapy and recommended short-term rehabilitation. Case management working on placement.  #2 COPD-no acute exacerbation. -Continue Symbicort, Spiriva.  #3 hypertension-hemodynamically stable. Continue Norvasc, metoprolol, Irbesertan.  #4 depression-continue Prozac.   #5 hyperlipidemia-continue simvastatin  #6 chronic kidney disease stage III-IV-creatinine close to baseline and will continue to monitor.   All  the records are reviewed and case discussed with Care Management/Social Workerr. Management plans discussed with the  patient, family and they are in agreement.  CODE STATUS: Full  DVT Prophylaxis: Lovenox  TOTAL TIME TAKING CARE OF THIS PATIENT: 25 minutes.   POSSIBLE D/C IN 2-3 DAYS, DEPENDING ON CLINICAL CONDITION.   Henreitta Leber M.D on 06/14/2015 at 3:26 PM  Between 7am to 6pm - Pager - [phone]  After 6pm go to [email or web address] - password [email or web address] [place] [facility]  Office  [phone]  CC: Primary care physician; Leeroy Cha

## 2015-06-14 NOTE — Evaluation (Signed)
Physical Therapy Evaluation Patient Details Name: Chad Simpson MRN: [id number] DOB: [birth year] Today's Date: 06/14/2015   History of Present Illness  Pt suferred a mechanical fall with a R hip fracture. He underwent R hip hemiarthroplasty and is POD#1 at time of evaluation. Orthopedic surgeon contacted who confirmed posterior precautions and start of therapy on 06/14/15 AM and PM.  Clinical Impression  Pt demonstrates RLE weakness as well as balance deficits limiting transfers and ambulation. Pain with all mobility. Pt appears to be having some visual and possible auditory (reports he hears his daughter) during session. RN notified. Pt will need SNF placement at discharge in order to return to prior level of function. Pt will benefit from skilled PT services to address deficits in strength, balance, and mobility in order to return to full function at home.     Follow Up Recommendations SNF    Equipment Recommendations  None recommended by PT    Recommendations for Other Services       Precautions / Restrictions Precautions Precautions: Fall;Posterior Hip (Right) Precaution Booklet Issued: Yes (comment) Restrictions Weight Bearing Restrictions: Yes RLE Weight Bearing: Weight bearing as tolerated      Mobility  Bed Mobility Overal bed mobility: Needs Assistance Bed Mobility: Supine to Sit     Supine to sit: Mod assist     General bed mobility comments: Cues for sequencing. Assist for trunk right and to maintain posterior hip precautions  Transfers Overall transfer level: Needs assistance Equipment used: Rolling walker (2 wheeled) Transfers: Sit to/from Stand Sit to Stand: Mod assist;+2 physical assistance         General transfer comment: Poor LE strength noted. Difficulty recalling precautions. Cues required to prevent excessive hip flexion. Pt becomes nauseous during transfer but does not vomit  Ambulation/Gait Ambulation/Gait assistance: Max assist;+2 physical  assistance Ambulation Distance (Feet): 2 Feet Assistive device: Rolling walker (2 wheeled) Gait Pattern/deviations: Step-to pattern;Shuffle   Gait velocity interpretation: <1.8 ft/sec, indicative of risk for recurrent falls General Gait Details: Pt with severe imbalance with standing. Unabel to remain upright without. Only able to take a few small shuffling steps to transfer from bed to recliner  Stairs            Wheelchair Mobility    Modified Rankin (Stroke Patients Only)       Balance Overall balance assessment: Needs assistance Sitting-balance support: No upper extremity supported Sitting balance-Leahy Scale: Fair     Standing balance support: Bilateral upper extremity supported Standing balance-Leahy Scale: Poor                               Pertinent Vitals/Pain Pain Assessment: No/denies pain (Denies pain at rest, groans with movement but doesn't rate)    Home Living Family/patient expects to be discharged to:: Private residence (Independent living facility) Living Arrangements: Spouse/significant other Available Help at Discharge: Family Type of Home: Apartment Home Access: Level entry     Home Layout: One level Home Equipment: Environmental consultant - 2 wheels      Prior Function Level of Independence: Independent with assistive device(s)               Hand Dominance        Extremity/Trunk Assessment   Upper Extremity Assessment: Generalized weakness           Lower Extremity Assessment: Generalized weakness;RLE deficits/detail RLE Deficits / Details: Unable to perform SLR without max assist. Assist for bed mobility.  Communication   Communication: HOH  Cognition Arousal/Alertness: Awake/alert Behavior During Therapy: Anxious Overall Cognitive Status: No family/caregiver present to determine baseline cognitive functioning Area of Impairment: Memory     Memory: Decreased recall of precautions;Decreased short-term memory          General Comments: AOx4. Unable to remember name of indepenent living facility. Reports he would call "[id number]" in case of emergency. Pt with visual hallucinations during session reporting that his daugther and wife are out in the parking lot. OT and RN both confirm with PT that no individuals are standing out in parking lot    General Comments      Exercises Total Joint Exercises Ankle Circles/Pumps: Strengthening;Both;Supine;10 reps Quad Sets: Strengthening;Both;Supine;10 reps Gluteal Sets: Strengthening;Both;10 reps;Supine Towel Squeeze: Strengthening;Both;10 reps;Supine Short Arc Quad: Strengthening;Both;10 reps;Supine Hip ABduction/ADduction: Strengthening;Both;10 reps;Supine Straight Leg Raises: Strengthening;Both;10 reps;Supine      Assessment/Plan    PT Assessment Patient needs continued PT services  PT Diagnosis Difficulty walking;Generalized weakness;Acute pain   PT Problem List Decreased strength;Decreased activity tolerance;Decreased balance;Decreased mobility;Decreased coordination;Decreased cognition;Decreased knowledge of use of DME;Decreased safety awareness;Decreased knowledge of precautions;Pain  PT Treatment Interventions DME instruction;Gait training;Functional mobility training;Therapeutic activities;Therapeutic exercise;Balance training;Neuromuscular re-education;Cognitive remediation;Manual techniques   PT Goals (Current goals can be found in the Care Plan section) Acute Rehab PT Goals Patient Stated Goal: "I know I will need to go to rehab" PT Goal Formulation: With patient Time For Goal Achievement: 06/28/15 Potential to Achieve Goals: Good    Frequency BID   Barriers to discharge        Co-evaluation               End of Session Equipment Utilized During Treatment: Gait belt Activity Tolerance: Patient limited by pain Patient left: in chair;with call bell/phone within reach;with chair alarm set;with SCD's reapplied (pillow between knees  for abduction, ice pack in place) Nurse Communication: Mobility status         Time: 1015-1050 PT Time Calculation (min) (ACUTE ONLY): 35 min   Charges:   PT Evaluation $Initial PT Evaluation Tier I: 1 Procedure PT Treatments $Therapeutic Exercise: 8-22 mins   PT G Codes:       Lyndel Safe Huprich PT, DPT   Huprich,Jason 06/14/2015, 11:08 AM

## 2015-06-15 ENCOUNTER — Other Ambulatory Visit: Payer: Medicare Other

## 2015-06-15 ENCOUNTER — Ambulatory Visit: Payer: Medicare Other

## 2015-06-15 LAB — BASIC METABOLIC PANEL
Anion gap: 8 (ref 5–15)
BUN: 43 mg/dL — ABNORMAL HIGH (ref 6–20)
CALCIUM: 8.3 mg/dL — AB (ref 8.9–10.3)
CO2: 16 mmol/L — ABNORMAL LOW (ref 22–32)
Chloride: 116 mmol/L — ABNORMAL HIGH (ref 101–111)
Creatinine, Ser: 3.43 mg/dL — ABNORMAL HIGH (ref 0.61–1.24)
GFR calc Af Amer: 18 mL/min — ABNORMAL LOW (ref >60)
GFR, EST NON AFRICAN AMERICAN: 15 mL/min — AB (ref >60)
Glucose, Bld: 98 mg/dL (ref 65–99)
Potassium: 3.6 mmol/L (ref 3.5–5.1)
Sodium: 140 mmol/L (ref 135–145)

## 2015-06-15 LAB — CBC
HEMATOCRIT: 29.2 % — AB (ref 40.0–52.0)
Hemoglobin: 9.3 g/dL — ABNORMAL LOW (ref 13.0–18.0)
MCH: 31.4 pg (ref 26.0–34.0)
MCHC: 31.8 g/dL — AB (ref 32.0–36.0)
MCV: 98.7 fL (ref 80.0–100.0)
Platelets: 168 10*3/uL (ref 150–440)
RBC: 2.95 MIL/uL — ABNORMAL LOW (ref 4.40–5.90)
RDW: 19.6 % — ABNORMAL HIGH (ref 11.5–14.5)
WBC: 11.9 10*3/uL — ABNORMAL HIGH (ref 3.8–10.6)

## 2015-06-15 MED ORDER — APIXABAN 2.5 MG PO TABS
2.5000 mg | ORAL_TABLET | Freq: Two times a day (BID) | ORAL | Status: DC
  Administered 2015-06-16: 2.5 mg via ORAL
  Filled 2015-06-15: qty 1

## 2015-06-15 MED ORDER — APIXABAN 2.5 MG PO TABS: 2.5000 mg | ORAL_TABLET | Freq: Two times a day (BID) | ORAL | Status: DC

## 2015-06-15 MED ORDER — HYDRALAZINE HCL 20 MG/ML IJ SOLN
10.0000 mg | Freq: Four times a day (QID) | INTRAMUSCULAR | Status: DC | PRN
  Administered 2015-06-15: 10 mg via INTRAVENOUS
  Filled 2015-06-15: qty 1

## 2015-06-15 NOTE — Progress Notes (Signed)
  Subjective: 2 Days Post-Op Procedure(s) (LRB): ARTHROPLASTY BIPOLAR HIP (HEMIARTHROPLASTY) (Right) Patient reports pain as moderate.   Patient seen in rounds with Dr. Marry Guan. Patient is well, and has had no acute complaints or problems Plan is to go Skilled nursing facility after hospital stay. The patient has received several that offers, and would like to go to [facility]. Negative for chest pain and shortness of breath Fever: no Gastrointestinal: Negative for nausea and vomiting  Objective: Vital signs in last 24 hours: Temp:  [97.2 F (36.2 C)-98.6 F (37 C)] 98.6 F (37 C) (08/03 0426) Pulse Rate:  [62-79] 79 (08/03 0426) Resp:  [18-19] 18 (08/03 0426) BP: (167-187)/(59-87) 187/78 mmHg (08/03 0426) SpO2:  [93 %-100 %] 96 % (08/03 0426)  Intake/Output from previous day:  Intake/Output Summary (Last 24 hours) at 06/15/15 0624 Last data filed at 06/15/15 0512  Gross per 24 hour  Intake 4426.67 ml  Output   1950 ml  Net 2476.67 ml    Intake/Output this shift: Total I/O In: 1406.7 [I.V.:1356.7; IV Piggyback:50] Out: 1950 [Urine:1950]  Labs:  Recent Labs  06/12/15 1815 06/13/15 0458 06/14/15 0445 06/15/15 0543  HGB 11.0* 10.0* 9.1* 9.3*    Recent Labs  06/14/15 0445 06/15/15 0543  WBC 16.0* 11.9*  RBC 2.97* 2.95*  HCT 29.4* 29.2*  PLT 189 168    Recent Labs  06/14/15 0445 06/15/15 0543  NA 140 140  K 4.5 3.6  CL 117* 116*  CO2 17* 16*  BUN 43* 43*  CREATININE 3.16* 3.43*  GLUCOSE 125* 98  CALCIUM 8.2* 8.3*    Recent Labs  06/12/15 1815  INR 1.00     EXAM General - Patient is Alert and Oriented Extremity - Dorsiflexion/Plantar flexion intact No cellulitis present Compartment soft Dressing/Incision - clean, dry, no drainage. The Hemovac dressing is intact. Motor Function - intact, moving foot and toes well on exam. The patient ambulated in the room with physical therapy.  Past Medical History  Diagnosis Date  . Anemia 04/27/2015   . Cancer   . Hypertension   . COPD (chronic obstructive pulmonary disease)   . High cholesterol     Assessment/Plan: 2 Days Post-Op Procedure(s) (LRB): ARTHROPLASTY BIPOLAR HIP (HEMIARTHROPLASTY) (Right) Principal Problem:   Femoral neck fracture  Estimated body mass index is 21.56 kg/(m^2) as calculated from the following:   Height as of this encounter: 5\' 7"  (1.702 m).   Weight as of this encounter: 62.46 kg (137 lb 11.2 oz). Discharge to SNF when cleared medically.  DVT Prophylaxis - Lovenox, Foot Pumps and TED hose Weight-Bearing as tolerated to right leg  Reche Dixon, PA-C Orthopaedic Surgery 06/15/2015, 6:24 AM

## 2015-06-15 NOTE — Progress Notes (Signed)
Physical Therapy Treatment Patient Details Name: Chad Simpson MRN: [id number] DOB: [birth year] Today's Date: 06/15/2015    History of Present Illness Pt suferred a mechanical fall with a R hip fracture. He underwent R hip hemiarthroplasty and is POD#1 at time of evaluation. Orthopedic surgeon contacted who confirmed posterior precautions and start of therapy on 06/14/15 AM and PM.    PT Comments    Pt progressing slowly with bed mobility, transfers and ambulation. Encouraged continued isometric strengthening throughout the day for improved functional mobility. Pt comfortable up in chair in slightly reclined position adhering to hip precautions. Reinforced hip precautions. Plan to see pt this pm.   Follow Up Recommendations  SNF     Equipment Recommendations  None recommended by PT    Recommendations for Other Services       Precautions / Restrictions Precautions Precautions: Posterior Hip;Fall Restrictions Weight Bearing Restrictions: Yes RLE Weight Bearing: Weight bearing as tolerated    Mobility  Bed Mobility Overal bed mobility: Needs Assistance Bed Mobility: Supine to Sit     Supine to sit: Mod assist     General bed mobility comments: Cues for posterior hip precautions to avoid flexing at hips too much  Transfers Overall transfer level: Needs assistance Equipment used: Rolling walker (2 wheeled) Transfers: Sit to/from Stand Sit to Stand: Mod assist (initial 2x, pt attains stand then sits spontaneously )         General transfer comment: Third stand, pt encouraged to squeeze and knee and backside to maintain stand, pt compliant and successful  Ambulation/Gait Ambulation/Gait assistance: Min assist Ambulation Distance (Feet): 3 Feet Assistive device: Rolling walker (2 wheeled) Gait Pattern/deviations:  (sidestep to chair with little clearance of B feet)   Gait velocity interpretation: <1.8 ft/sec, indicative of risk for recurrent falls General Gait Details:  Attempts to step feet with instruction; little clearance but does so   Stairs            Wheelchair Mobility    Modified Rankin (Stroke Patients Only)       Balance Overall balance assessment: Needs assistance Sitting-balance support: No upper extremity supported Sitting balance-Leahy Scale: Good     Standing balance support: Bilateral upper extremity supported Standing balance-Leahy Scale: Poor                      Cognition Arousal/Alertness: Awake/alert Behavior During Therapy: WFL for tasks assessed/performed Overall Cognitive Status: Within Functional Limits for tasks assessed       Memory: Decreased recall of precautions              Exercises Total Joint Exercises Ankle Circles/Pumps: AROM;Both;20 reps;Seated Quad Sets: Strengthening;Both;20 reps;Seated Gluteal Sets: Strengthening;Both;20 reps;Seated Towel Squeeze: Strengthening;Both;20 reps;Seated    General Comments        Pertinent Vitals/Pain Pain Assessment: 0-10 Pain Score: 2  Pain Location: R hip (yells in pain with bed mobility) Pain Intervention(s): Monitored during session;Premedicated before session;Limited activity within patient's tolerance    Home Living                      Prior Function            PT Goals (current goals can now be found in the care plan section) Progress towards PT goals: Progressing toward goals    Frequency  BID    PT Plan Current plan remains appropriate    Co-evaluation  End of Session Equipment Utilized During Treatment: Gait belt Activity Tolerance: Patient limited by pain (Limited by weakness) Patient left: in chair;with call bell/phone within reach;with chair alarm set     Time: 1120-1145 PT Time Calculation (min) (ACUTE ONLY): 25 min  Charges:  $Gait Training: 8-22 mins $Therapeutic Exercise: 8-22 mins                    G Codes:      Charlaine Dalton 06/15/2015, 1:31 PM

## 2015-06-15 NOTE — Progress Notes (Signed)
Plan is for patient to go to [facility] tomorrow (06/16/15). [facility] admissions coordinator at [facility] is aware of above. Per Seth Bake patient will have a private room. Clinical Social Worker (CSW) will continue to follow and assist as needed.   Blima Rich, [place] [phone]

## 2015-06-15 NOTE — Progress Notes (Signed)
Dr.Sainani here to round, updated with patient's blood pressure elevated. MD to place order for prn blood pressure medication.

## 2015-06-15 NOTE — Progress Notes (Signed)
[facility] [place] NAME: Chad Simpson    MR#:  [id number]  DATE OF BIRTH:  20-Feb-1930  SUBJECTIVE:  CHIEF COMPLAINT:   Chief Complaint  Patient presents with  . Fall   Patient here due to mechanical fall and noted to have a right hip fracture. Patient is postop day #2 from partial right hip replacement. Working with physical therapy but still having some right hip pain.  Still has not had a BM.   REVIEW OF SYSTEMS:    Review of Systems  Constitutional: Negative for fever and chills.  HENT: Negative for congestion and tinnitus.   Eyes: Negative for blurred vision and double vision.  Respiratory: Negative for cough, shortness of breath and wheezing.   Cardiovascular: Negative for chest pain, orthopnea and PND.  Gastrointestinal: Positive for constipation. Negative for nausea, vomiting, abdominal pain and diarrhea.  Genitourinary: Negative for dysuria and hematuria.  Musculoskeletal: Positive for joint pain (right hip pain).  Neurological: Negative for dizziness, sensory change and focal weakness.  All other systems reviewed and are negative.   Nutrition: Heart healthy Tolerating Diet: Yes Tolerating PT: Yes   DRUG ALLERGIES:   Allergies  Allergen Reactions  . Ciprofloxacin Nausea Only  . Erythromycin Nausea Only    VITALS:  Blood pressure 147/55, pulse 64, temperature 98.5 F (36.9 C), temperature source Oral, resp. rate 16, height 5\' 7"  (1.702 m), weight 62.46 kg (137 lb 11.2 oz), SpO2 97 %.  PHYSICAL EXAMINATION:   Physical Exam  GENERAL:  81 y.o.-year-old patient lying in the bed with no acute distress.  EYES: Pupils equal, round, reactive to light and accommodation. No scleral icterus. Extraocular muscles intact.  HEENT: Head atraumatic, normocephalic. Oropharynx and nasopharynx clear.  NECK:  Supple, no jugular venous distention. No thyroid enlargement, no tenderness.  LUNGS: Normal breath sounds  bilaterally, no wheezing, rales, rhonchi. No use of accessory muscles of respiration.  CARDIOVASCULAR: S1, S2 RRR. No murmurs, rubs, or gallops.  ABDOMEN: Soft, nontender, nondistended. Bowel sounds present. No organomegaly or mass.  EXTREMITIES: No cyanosis, clubbing or edema b/l.  Right hip dressing from recent surgery  NEUROLOGIC: Cranial nerves II through XII are intact. No focal Motor or sensory deficits b/l.   PSYCHIATRIC: The patient is alert and oriented x 3. Good affect SKIN: No obvious rash, lesion, or ulcer.    LABORATORY PANEL:   CBC  Recent Labs Lab 06/15/15 0543  WBC 11.9*  HGB 9.3*  HCT 29.2*  PLT 168   ------------------------------------------------------------------------------------------------------------------  Chemistries   Recent Labs Lab 06/15/15 0543  NA 140  K 3.6  CL 116*  CO2 16*  GLUCOSE 98  BUN 43*  CREATININE 3.43*  CALCIUM 8.3*   ------------------------------------------------------------------------------------------------------------------  Cardiac Enzymes  Recent Labs Lab 06/12/15 1815  TROPONINI <0.03   ------------------------------------------------------------------------------------------------------------------  RADIOLOGY:  Dg Hip Port Unilat With Pelvis 1v Right  06/14/2015   CLINICAL DATA:  Postoperative images.  Right hip fracture.  EXAM: DG HIP (WITH OR WITHOUT PELVIS) 1V PORT RIGHT  COMPARISON:  Radiographs 06/12/2015  FINDINGS: There is a right femoral prosthesis, appearing well aligned. Skin staples are noted. No immediate complication is evident.  IMPRESSION: Right hip prosthesis.   Electronically Signed   By: Andreas Newport M.D.   On: 06/14/2015 01:11     ASSESSMENT AND PLAN:   81 year old male with history of hypertension, COPD, hyperlipidemia, chronic anemia who presented to the hospital after a mechanical fall and noted to have a  right hip fracture.  #1 right hip fracture-this is likely secondary to  mechanical fall. -Patient is status post partial right hip replacement. POD #2 -Continue pain control as per orthopedics. Seen by physical therapy and recommended short-term rehabilitation. Likely discharged to short-term rehabilitation tomorrow. -Discussed with pharmacy and will start Eliquis for DVT prophylaxis and DC Lovenox.  #2 COPD-no acute exacerbation. -Continue Symbicort, Spiriva.  #3 hypertension-BP a bit elevated earlier but not improved. - Continue Norvasc, metoprolol, Irbesertan.  #4 depression-continue Prozac.   #5 hyperlipidemia-continue simvastatin  #6 chronic kidney disease stage III-IV-creatinine close to baseline and will continue to monitor. -DC IV fluids.  #7 constipation-continue Dulcolax suppository, Senokot. Needs to have a BM prior to discharge tomorrow.  All the records are reviewed and case discussed with Care Management/Social Workerr. Management plans discussed with the patient, family and they are in agreement.  CODE STATUS: Full  DVT Prophylaxis: Lovenox  TOTAL TIME TAKING CARE OF THIS PATIENT: 25 minutes.   POSSIBLE D/C tomorrow morning to SNF  Henreitta Leber M.D on 06/15/2015 at 2:26 PM  Between 7am to 6pm - Pager - [phone]  After 6pm go to [email or web address] - password [email or web address] [facility]  Office  [phone]  CC: Primary care physician; Leeroy Cha

## 2015-06-15 NOTE — Progress Notes (Signed)
Physical Therapy Treatment Patient Details Name: Chad Simpson MRN: [id number] DOB: 10/06/1930 Today's Date: 06/15/2015    History of Present Illness Pt suferred a mechanical fall with a R hip fracture. He underwent R hip hemiarthroplasty and is POD#1 at time of evaluation. Orthopedic surgeon contacted who confirmed posterior precautions and start of therapy on 06/14/15 AM and PM.    PT Comments    Pt remained in chair throughout morn and early afternoon comfortably. States fatigue and wishes back to bed. Improving sit to stand transfer with ability to stand on first attempt; yet, continues to require cues for compliance with posterior hip precautions and assist for balance as well as activation of quads and hip extensors. Mildly improved ability to sidestep for transfer from chair to bed. Continue PT for improved functional mobility and compliance with hip precautions.   Follow Up Recommendations  SNF     Equipment Recommendations  None recommended by PT    Recommendations for Other Services       Precautions / Restrictions Precautions Precautions: Posterior Hip;Fall Restrictions Weight Bearing Restrictions: Yes RLE Weight Bearing: Weight bearing as tolerated    Mobility  Bed Mobility Overal bed mobility: Needs Assistance Bed Mobility: Sit to Supine     Supine to sit: Mod assist Sit to supine: Mod assist   General bed mobility comments: Cues for posterior hip precautions to avoid flexing at hips too much  Transfers Overall transfer level: Needs assistance Equipment used: Rolling walker (2 wheeled) Transfers: Sit to/from Stand Sit to Stand: Mod assist         General transfer comment: Sucess on 1st attempt; constant cueing to maintain upriight posture.   Ambulation/Gait Ambulation/Gait assistance: Min assist Ambulation Distance (Feet): 5 Feet Assistive device: Rolling walker (2 wheeled) Gait Pattern/deviations: Step-to pattern;Decreased weight shift to  right;Decreased stance time - right;Decreased step length - right;Decreased step length - left (Mild improved ability to clear feet from this morning)   Gait velocity interpretation: <1.8 ft/sec, indicative of risk for recurrent falls General Gait Details: Requires assist for balance   Stairs            Wheelchair Mobility    Modified Rankin (Stroke Patients Only)       Balance Overall balance assessment: Needs assistance (cues for compliance with posterior hip precautions) Sitting-balance support: No upper extremity supported Sitting balance-Leahy Scale: Good     Standing balance support: Bilateral upper extremity supported Standing balance-Leahy Scale: Poor                      Cognition Arousal/Alertness: Awake/alert Behavior During Therapy: WFL for tasks assessed/performed Overall Cognitive Status: Within Functional Limits for tasks assessed       Memory: Decreased recall of precautions (Requires constant cues to prevent flexing too far at hips )              Exercises Total Joint Exercises Ankle Circles/Pumps: AROM;Both;20 reps;Seated Quad Sets: Strengthening;Both;20 reps;Supine Gluteal Sets: Strengthening;Both;20 reps;Supine Towel Squeeze: Strengthening;Both;20 reps;Supine Short Arc Quad: Both;Supine;20 reps;AROM Heel Slides: AAROM;Both;20 reps;Supine Hip ABduction/ADduction: AAROM;Both;20 reps;Supine Straight Leg Raises: AAROM;Both;20 reps;Supine    General Comments        Pertinent Vitals/Pain Pain Assessment: 0-10 Pain Score: 1  Pain Location: R hip Pain Intervention(s): Monitored during session    Home Living                      Prior Function  PT Goals (current goals can now be found in the care plan section) Progress towards PT goals: Progressing toward goals    Frequency  BID    PT Plan Current plan remains appropriate    Co-evaluation             End of Session Equipment Utilized During  Treatment: Gait belt Activity Tolerance: Patient limited by pain Patient left: in bed;with call bell/phone within reach;with bed alarm set     Time: 1347-1419 PT Time Calculation (min) (ACUTE ONLY): 32 min  Charges:  $Gait Training: 8-22 mins $Therapeutic Exercise: 8-22 mins                    G Codes:      Charlaine Dalton 06/15/2015, 2:16 PM

## 2015-06-15 NOTE — Progress Notes (Signed)
Occupational Therapy Treatment Patient Details Name: Chad Simpson MRN: [id number] DOB: 1930/07/08 Today's Date: 06/15/2015    History of present illness Pt suferred a mechanical fall with a R hip fracture. He underwent R hip hemiarthroplasty and is POD#1 at time of evaluation. Orthopedic surgeon contacted who confirmed posterior precautions and start of therapy on 06/14/15 AM and PM.   OT comments  Patient more alert today, named 2 of 3 hip precautions  Follow Up Recommendations  SNF    Equipment Recommendations       Recommendations for Other Services      Precautions / Restrictions Precautions Precautions: Posterior Hip Restrictions Weight Bearing Restrictions: Yes RLE Weight Bearing: Weight bearing as tolerated          Balance                   ADL  Reviewed hip precautions, patient able to name purpose of hip kit and demonstrated their use.                                              Vision                     Perception     Praxis      Cognition   Behavior During Therapy: WFL for tasks assessed/performed Overall Cognitive Status: Within Functional Limits for tasks assessed                     Extremity/Trunk Assessment                  Shoulder Instructions       General Comments      Pertinent Vitals/ Pain       Pain Assessment: 0-10 Pain Score: 1  Pain Location: R hip Pain Intervention(s): Monitored during session  Home Living                                          Prior Functioning/Environment              Frequency       Progress Toward Goals  OT Goals(current goals can now be found in the care plan section)  Progress towards OT goals: Progressing toward goals     Plan      Co-evaluation                 End of Session Equipment Utilized During Treatment:  (hip kit)   Activity Tolerance     Patient Left in bed;with call bell/phone within  reach;with bed alarm set   Nurse Communication          Time: 5465-0354 OT Time Calculation (min): 10 min  Charges: OT General Charges $OT Visit: 1 Procedure OT Treatments $Self Care/Home Management : 8-22 mins  Myrene Galas, MS/OTR/L  06/15/2015, 3:56 PM

## 2015-06-16 LAB — CBC
HEMATOCRIT: 26 % — AB (ref 40.0–52.0)
HEMOGLOBIN: 8.2 g/dL — AB (ref 13.0–18.0)
MCH: 30.9 pg (ref 26.0–34.0)
MCHC: 31.6 g/dL — AB (ref 32.0–36.0)
MCV: 97.7 fL (ref 80.0–100.0)
Platelets: 166 10*3/uL (ref 150–440)
RBC: 2.66 MIL/uL — ABNORMAL LOW (ref 4.40–5.90)
RDW: 19.8 % — ABNORMAL HIGH (ref 11.5–14.5)
WBC: 11.5 10*3/uL — ABNORMAL HIGH (ref 3.8–10.6)

## 2015-06-16 LAB — SURGICAL PATHOLOGY

## 2015-06-16 MED ORDER — APIXABAN 2.5 MG PO TABS: 2.5000 mg | ORAL_TABLET | Freq: Two times a day (BID) | ORAL | Status: DC

## 2015-06-16 MED ORDER — OXYCODONE HCL 5 MG PO TABS: 5.0000 mg | ORAL_TABLET | ORAL | Status: DC | PRN

## 2015-06-16 MED ORDER — TRAMADOL HCL 50 MG PO TABS: 50.0000 mg | ORAL_TABLET | ORAL | Status: DC | PRN

## 2015-06-16 NOTE — Progress Notes (Signed)
PT DISCHARGED TO [facility] . REPORT CALLED TO [facility]. [name] LEFT VIA EMS. RECEIVED ELIQUIS TODAY AND STAFF NOTIFIED

## 2015-06-16 NOTE — Progress Notes (Signed)
Patient is medically stable for D/C to [facility] today. Per Seth Bake admissions coordinator at [facility] patient is going to private room 212. RN will call report at [phone]. Clinical Education officer, museum (CSW) prepared D/C packet and sent D/C Summary to [employer] via carefinder. CSW left a voicemail with patient's daughter Colletta [place]. CSW also contacted patient's daughter Jeannene Patella and made her aware of above. Per Pam she is meeting with Seth Bake at 1 pm today to complete admissions paper work. Please reconsult if future social work needs arise. CSW signing off.   Blima Rich, [address] [phone]

## 2015-06-16 NOTE — Care Management Important Message (Signed)
Important Message  Patient Details  Name: TRINIDAD PETRON MRN: [id number] Date of Birth: 1929/12/29   Medicare Important Message Given:  Yes-third notification given    Juliann Pulse A Allmond 06/16/2015, 9:20 AM

## 2015-06-16 NOTE — Progress Notes (Addendum)
  Subjective: 3 Days Post-Op Procedure(s) (LRB): ARTHROPLASTY BIPOLAR HIP (HEMIARTHROPLASTY) (Right) Patient reports pain as mild.   Patient seen in rounds with Dr. Marry Guan. Patient is well, and has had no acute complaints or problems. The patient has postop acute blood loss anemia secondary to his hip fracture. Plan is to go Skilled nursing facility after hospital stay. Negative for chest pain and shortness of breath Fever: no Gastrointestinal: Negative for nausea and vomiting  Objective: Vital signs in last 24 hours: Temp:  [97.4 F (36.3 C)-98.5 F (36.9 C)] 98.4 F (36.9 C) (08/04 0549) Pulse Rate:  [64-80] 80 (08/04 0549) Resp:  [16-18] 18 (08/04 0549) BP: (142-200)/(53-78) 168/59 mmHg (08/04 0549) SpO2:  [93 %-98 %] 93 % (08/04 0549)  Intake/Output from previous day:  Intake/Output Summary (Last 24 hours) at 06/16/15 0553 Last data filed at 06/15/15 1713  Gross per 24 hour  Intake    480 ml  Output    250 ml  Net    230 ml    Intake/Output this shift:    Labs:  Recent Labs  06/14/15 0445 06/15/15 0543 06/16/15 0413  HGB 9.1* 9.3* 8.2*    Recent Labs  06/15/15 0543 06/16/15 0413  WBC 11.9* 11.5*  RBC 2.95* 2.66*  HCT 29.2* 26.0*  PLT 168 166    Recent Labs  06/14/15 0445 06/15/15 0543  NA 140 140  K 4.5 3.6  CL 117* 116*  CO2 17* 16*  BUN 43* 43*  CREATININE 3.16* 3.43*  GLUCOSE 125* 98  CALCIUM 8.2* 8.3*   No results for input(s): LABPT, INR in the last 72 hours.   EXAM General - Patient is Alert and Oriented Extremity - Neurovascular intact Dorsiflexion/Plantar flexion intact No cellulitis present Compartment soft Dressing/Incision - clean, dry, no drainage Motor Function - intact, moving foot and toes well on exam. The patient is ambulating 5 feet with physical therapy  Past Medical History  Diagnosis Date  . Anemia 04/27/2015  . Cancer   . Hypertension   . COPD (chronic obstructive pulmonary disease)   . High cholesterol      Assessment/Plan: 3 Days Post-Op Procedure(s) (LRB): ARTHROPLASTY BIPOLAR HIP (HEMIARTHROPLASTY) (Right) Principal Problem:   Femoral neck fracture  Estimated body mass index is 21.56 kg/(m^2) as calculated from the following:   Height as of this encounter: 5\' 7"  (1.702 m).   Weight as of this encounter: 62.46 kg (137 lb 11.2 oz). Discharge to SNF if cleared by medicine today.  DVT Prophylaxis - Eliquis, calf pumps, ted hose Weight-Bearing as tolerated to right leg  Reche Dixon, PA-C Orthopaedic Surgery 06/16/2015, 5:53 AM

## 2015-06-16 NOTE — Clinical Social Work Placement (Signed)
   CLINICAL SOCIAL WORK PLACEMENT  NOTE  Date:  06/16/2015  Patient Details  Name: Chad Simpson MRN: [id number] Date of Birth: 10/31/1930  Clinical Social Work is seeking post-discharge placement for this patient at the [place] level of care (*CSW will initial, date and re-position this form in  chart as items are completed):  Yes   Patient/family provided with [place] Work Department's list of facilities offering this level of care within the geographic area requested by the patient (or if unable, by the patient's family).  Yes   Patient/family informed of their freedom to choose among providers that offer the needed level of care, that participate in Medicare, Medicaid or managed care program needed by the patient, have an available bed and are willing to accept the patient.  Yes   Patient/family informed of [facility]'s ownership interest in [facility] and [facility], as well as of the fact that they are under no obligation to receive care at these facilities.  PASRR submitted to EDS on 06/13/15     PASRR number received on 06/13/15     Existing PASRR number confirmed on       FL2 transmitted to all facilities in geographic area requested by pt/family on 06/14/15     FL2 transmitted to all facilities within larger geographic area on       Patient informed that his/her managed care company has contracts with or will negotiate with certain facilities, including the following:        Yes   Patient/family informed of bed offers received.  Patient chooses bed at  [facility] )     Physician recommends and patient chooses bed at      Patient to be transferred to  [facility] ) on 06/16/15.  Patient to be transferred to facility by  [facility] )     Patient family notified on 06/16/15 of transfer.  Name of family member notified:   (Daughter Pam aware of D/C. )     PHYSICIAN       Additional Comment:     _______________________________________________ Loralyn Freshwater, LCSW 06/16/2015, 9:43 AM

## 2015-06-16 NOTE — Discharge Summary (Signed)
[place] [facility] [place] NAME: Chad Simpson    MR#:  [id number]  DATE OF BIRTH:  07-05-1930  DATE OF ADMISSION:  06/12/2015 ADMITTING PHYSICIAN: Lytle Butte, MD  DATE OF DISCHARGE: 06/16/2015 PRIMARY CARE PHYSICIAN: Leeroy Cha    ADMISSION DIAGNOSIS:  Knee pain [M25.569] Hip pain, acute, right [M25.551] Hip fracture, right, closed, initial encounter [S72.001A]  DISCHARGE DIAGNOSIS:  Principal Problem:   Femoral neck fracture   SECONDARY DIAGNOSIS:   Past Medical History  Diagnosis Date  . Anemia 04/27/2015  . Cancer   . Hypertension   . COPD (chronic obstructive pulmonary disease)   . High cholesterol     HOSPITAL COURSE:  81 year old male with history of hypertension, COPD, hyperlipidemia, chronic anemia who presented to the hospital after a mechanical fall and noted to have a right hip fracture.  #1 right hip fracture-Secondary to mechanical fall. Patient is status post partial right hip replacement. POD #3 Continue pain control and he will nee short-term rehabilitation.  He is on Eliquis for DVT prophylaxis post op due to renal failure we cannot use Lovenox. He will need this for 2 weeks total. He should follow up with ORTHO within that time frame He does have post operative anemia on chronic and will need a repeat CBC check on 06/18/2015.  #2 COPD: There was no acute exacerbation. He can continue Symbicort, Spiriva.  #3Essential  hypertension- Continue Norvasc, metoprolol, Irbesertan.  #4 depression-continue Prozac.   #5 hyperlipidemia-continue simvastatin  #6 chronic kidney disease stage III-IV-creatinine close to baseline. He needs outpatient nephrology follow up as well as repeat BMP on 06/18/2015.  #7 constipation-Continue bowel regimen  DISCHARGE CONDITIONS AND DIET:  Heart Healthy diet Patient is being discharged discoloration facility in stable condition  CONSULTS OBTAINED:  Treatment Team:   Lytle Butte, MD Dereck Leep, MD  DRUG ALLERGIES:   Allergies  Allergen Reactions  . Ciprofloxacin Nausea Only  . Erythromycin Nausea Only    DISCHARGE MEDICATIONS:   Current Discharge Medication List    START taking these medications   Details  apixaban (ELIQUIS) 2.5 MG TABS tablet Take 1 tablet (2.5 mg total) by mouth 2 (two) times daily. Qty: 60 tablet, Refills: o    oxyCODONE (OXY IR/ROXICODONE) 5 MG immediate release tablet Take 1-2 tablets (5-10 mg total) by mouth every 4 (four) hours as needed for breakthrough pain ((for MODERATE breakthrough pain)). Qty: 30 tablet, Refills: 0    traMADol (ULTRAM) 50 MG tablet Take 1-2 tablets (50-100 mg total) by mouth every 4 (four) hours as needed for moderate pain. Qty: 30 tablet, Refills: 0      CONTINUE these medications which have NOT CHANGED   Details  amLODipine (NORVASC) 10 MG tablet Take 10 mg by mouth daily.     budesonide-formoterol (SYMBICORT) 160-4.5 MCG/ACT inhaler Inhale 2 puffs into the lungs daily.     calcitRIOL (ROCALTROL) 0.25 MCG capsule Take 0.25 mcg by mouth. Mondays, Wednesdays, and Fridays.    docusate sodium (COLACE) 100 MG capsule Take 100 mg by mouth daily as needed. Three times a week as needed for constipation.    FLUoxetine (PROZAC) 20 MG capsule Take 20 mg by mouth daily.     hydrALAZINE (APRESOLINE) 10 MG tablet Take 10 mg by mouth 2 (two) times daily.    hydroxychloroquine (PLAQUENIL) 200 MG tablet Take 200 mg by mouth daily. Mondays, Wednesdays, and Fridays.    irbesartan (AVAPRO) 300 MG tablet Take 300  mg by mouth daily.     meclizine (ANTIVERT) 12.5 MG tablet Take 12.5 mg by mouth at bedtime as needed for dizziness.     metoprolol (LOPRESSOR) 100 MG tablet Take 100 mg by mouth 2 (two) times daily with a meal.     OLANZapine (ZYPREXA) 2.5 MG tablet Take 2.5 mg by mouth daily.     rosuvastatin (CRESTOR) 10 MG tablet Take 10 mg by mouth daily.    ferrous fumarate-iron polysaccharide  complex (TANDEM) 162-115.2 MG CAPS 1 tablet on Monday, Wednesday, and Friday Qty: 45 capsule, Refills: 1   Associated Diagnoses: Anemia, unspecified anemia type    tiotropium (SPIRIVA) 18 MCG inhalation capsule Place 18 mcg into inhaler and inhale daily.               Today   CHIEF COMPLAINT:  Patient reports no issues this morning. Patient is ready for discharge to skilled nursing facility.   VITAL SIGNS:  Blood pressure 167/63, pulse 79, temperature 97.4 F (36.3 C), temperature source Oral, resp. rate 18, height 5\' 7"  (1.702 m), weight 62.46 kg (137 lb 11.2 oz), SpO2 95 %.   REVIEW OF SYSTEMS:  Review of Systems  Constitutional: Negative for fever, chills and malaise/fatigue.  HENT: Negative for sore throat.   Eyes: Negative for blurred vision.  Respiratory: Negative for cough, hemoptysis, shortness of breath and wheezing.   Cardiovascular: Negative for chest pain, palpitations and leg swelling.  Gastrointestinal: Negative for nausea, vomiting, abdominal pain, diarrhea and blood in stool.  Genitourinary: Negative for dysuria.  Musculoskeletal: Negative for back pain.  Neurological: Negative for dizziness, tremors and headaches.  Endo/Heme/Allergies: Does not bruise/bleed easily.     PHYSICAL EXAMINATION:  GENERAL:  81 y.o.-year-old patient lying in the bed with no acute distress.  NECK:  Supple, no jugular venous distention. No thyroid enlargement, no tenderness.  LUNGS: Normal breath sounds bilaterally, no wheezing, rales,rhonchi  No use of accessory muscles of respiration.  CARDIOVASCULAR: S1, S2 normal. No murmurs, rubs, or gallops.  ABDOMEN: Soft, non-tender, non-distended. Bowel sounds present. No organomegaly or mass.  EXTREMITIES: No pedal edema, cyanosis, or clubbing.  PSYCHIATRIC: The patient is alert and oriented x 3.  SKIN: No obvious rash, lesion, or ulcer.   DATA REVIEW:   CBC  Recent Labs Lab 06/16/15 0413  WBC 11.5*  HGB 8.2*  HCT 26.0*   PLT 166    Chemistries   Recent Labs Lab 06/15/15 0543  NA 140  K 3.6  CL 116*  CO2 16*  GLUCOSE 98  BUN 43*  CREATININE 3.43*  CALCIUM 8.3*    Cardiac Enzymes  Recent Labs Lab 06/12/15 1815  TROPONINI <0.03    Microbiology Results  @MICRORSLT48 @  RADIOLOGY:  No results found.    Management plans discussed with the patient and he is in agreement. Stable for disskilled nursing facility  Patient should follow up with orthopedics in 1 week and nephrology in 1 week. CBC and BMP should be checked on 06/18/2015  CODE STATUS:     Code Status Orders        Start     Ordered   06/14/15 0147  Full code   Continuous     06/14/15 0146      TOTAL TIME TAKING CARE OF THIS PATIENT:  40 minutes.    Tija Biss M.D on 06/16/2015 at 8:13 AM  Between 7am to 6pm - Pager - [phone] After 6pm go to [email or web address] - password [email or web address] [facility]  Office  [phone]  CC: Primary care physician; Leeroy Cha

## 2015-06-16 NOTE — Discharge Instructions (Signed)
INSTRUCTIONS AFTER Surgery  o Remove items at home which could result in a fall. This includes throw rugs or furniture in walking pathways o ICE to the affected joint every three hours while awake for 30 minutes at a time, for at least the first 3-5 days, and then as needed for pain and swelling.  Continue to use ice for pain and swelling. You may notice swelling that will progress down to the foot and ankle.  This is normal after surgery.  Elevate your leg when you are not up walking on it.   o Continue to use the breathing machine you got in the hospital (incentive spirometer) which will help keep your temperature down.  It is common for your temperature to cycle up and down following surgery, especially at night when you are not up moving around and exerting yourself.  The breathing machine keeps your lungs expanded and your temperature down.   DIET:  As you were doing prior to hospitalization, we recommend a well-balanced diet.  DRESSING / WOUND CARE / SHOWERING  Keep the surgical dressing until follow up.  The dressing is water proof, so you can shower without any extra covering.  IF THE DRESSING FALLS OFF or the wound gets wet inside, change the dressing with sterile gauze.  Please use good hand washing techniques before changing the dressing.  Do not use any lotions or creams on the incision until instructed by your surgeon.    ACTIVITY  o Increase activity slowly as tolerated, but follow the weight bearing instructions below.   o No driving for 6 weeks or until further direction given by your physician.  You cannot drive while taking narcotics.  o No lifting or carrying greater than 10 lbs. until further directed by your surgeon. o Avoid periods of inactivity such as sitting longer than an hour when not asleep. This helps prevent blood clots.  o You may return to work once you are authorized by your doctor.     WEIGHT BEARING   Weight bearing as tolerated with assist device (walker,  cane, etc) as directed, use it as long as suggested by your surgeon or therapist, typically at least 4-6 weeks.   EXERCISES  Results after joint surgery are often greatly improved when you follow the exercise, range of motion and muscle strengthening exercises prescribed by your doctor. Safety measures are also important to protect the joint from further injury. Any time any of these exercises cause you to have increased pain or swelling, decrease what you are doing until you are comfortable again and then slowly increase them. If you have problems or questions, call your caregiver or physical therapist for advice.   Rehabilitation is important following a joint surgery. After just a few days of immobilization, the muscles of the leg can become weakened and shrink (atrophy).  These exercises are designed to build up the tone and strength of the thigh and leg muscles and to improve motion. Often times heat used for twenty to thirty minutes before working out will loosen up your tissues and help with improving the range of motion but do not use heat for the first two weeks following surgery (sometimes heat can increase post-operative swelling).   These exercises can be done on a training (exercise) mat, on the floor, on a table or on a bed. Use whatever works the best and is most comfortable for you.    Use music or television while you are exercising so that the exercises are  a pleasant break in your day. This will make your life better with the exercises acting as a break in your routine that you can look forward to.   Perform all exercises about fifteen times, three times per day or as directed.  You should exercise both the operative leg and the other leg as well.  Exercises include:    Quad Sets - Tighten up the muscle on the front of the thigh (Quad) and hold for 5-10 seconds.    Straight Leg Raises - With your knee straight (if you were given a brace, keep it on), lift the leg to 60 degrees,  hold for 3 seconds, and slowly lower the leg.  Perform this exercise against resistance later as your leg gets stronger.   Leg Slides: Lying on your back, slowly slide your foot toward your buttocks, bending your knee up off the floor (only go as far as is comfortable). Then slowly slide your foot back down until your leg is flat on the floor again.   Angel Wings: Lying on your back spread your legs to the side as far apart as you can without causing discomfort.   Hamstring Strength:  Lying on your back, push your heel against the floor with your leg straight by tightening up the muscles of your buttocks.  Repeat, but this time bend your knee to a comfortable angle, and push your heel against the floor.  You may put a pillow under the heel to make it more comfortable if necessary.   A rehabilitation program following joint surgery can speed recovery and prevent re-injury in the future due to weakened muscles. Contact your doctor or a physical therapist for more information on knee rehabilitation.    CONSTIPATION  Constipation is defined medically as fewer than three stools per week and severe constipation as less than one stool per week.  Even if you have a regular bowel pattern at home, your normal regimen is likely to be disrupted due to multiple reasons following surgery.  Combination of anesthesia, postoperative narcotics, change in appetite and fluid intake all can affect your bowels.   YOU MUST use at least one of the following options; they are listed in order of increasing strength to get the job done.  They are all available over the counter, and you may need to use some, POSSIBLY even all of these options:    Drink plenty of fluids (prune juice may be helpful) and high fiber foods Colace 100 mg by mouth twice a day  Senokot for constipation as directed and as needed Dulcolax (bisacodyl), take with full glass of water  Miralax (polyethylene glycol) once or twice a day as needed.  If  you have tried all these things and are unable to have a bowel movement in the first 3-4 days after surgery call either your surgeon or your primary doctor.    If you experience loose stools or diarrhea, hold the medications until you stool forms back up.  If your symptoms do not get better within 1 week or if they get worse, check with your doctor.  If you experience "the worst abdominal pain ever" or develop nausea or vomiting, please contact the office immediately for further recommendations for treatment.   ITCHING:  If you experience itching with your medications, try taking only a single pain pill, or even half a pain pill at a time.  You can also use Benadryl over the counter for itching or also to help with sleep.  TED HOSE STOCKINGS:  Use stockings on both legs until for at least 2 weeks or as directed by physician office. They may be removed at night for sleeping.  MEDICATIONS:  See your medication summary on the After Visit Summary that nursing will review with you.  You may have some home medications which will be placed on hold until you complete the course of blood thinner medication.  It is important for you to complete the blood thinner medication as prescribed.  PRECAUTIONS:  If you experience chest pain or shortness of breath - call 911 immediately for transfer to the hospital emergency department.   If you develop a fever greater that 101 F, purulent drainage from wound, increased redness or drainage from wound, foul odor from the wound/dressing, or calf pain - CONTACT YOUR SURGEON.                                                   FOLLOW-UP APPOINTMENTS:  If you do not already have a post-op appointment, please call the office for an appointment to be seen by your surgeon.  Guidelines for how soon to be seen are listed in your After Visit Summary, but are typically between 1-4 weeks after surgery.  OTHER INSTRUCTIONS:   Remove the staples 2 weeks after surgery. Apply  Steri-Strips after the staples are removed. Keep the dressing on the hip for several days after the staples are removed. Contact Dr. Marry Guan if any opening of the incision or drainage.  MAKE SURE YOU:   Understand these instructions.   Get help right away if you are not doing well or get worse.    Thank you for letting us be a part of your medical care team.  It is a privilege we respect greatly.  We hope these instructions will help you stay on track for a fast and full recovery!

## 2015-06-20 DIAGNOSIS — F339 Major depressive disorder, recurrent, unspecified: Secondary | ICD-10-CM

## 2015-06-20 DIAGNOSIS — N184 Chronic kidney disease, stage 4 (severe): Secondary | ICD-10-CM | POA: Diagnosis not present

## 2015-06-20 DIAGNOSIS — F0390 Unspecified dementia without behavioral disturbance: Secondary | ICD-10-CM

## 2015-06-20 DIAGNOSIS — S7291XA Unspecified fracture of right femur, initial encounter for closed fracture: Secondary | ICD-10-CM

## 2015-06-21 ENCOUNTER — Emergency Department: Payer: Medicare Other

## 2015-06-21 ENCOUNTER — Emergency Department
Admission: EM | Admit: 2015-06-21 | Discharge: 2015-06-21 | Disposition: A | Discharge status: Home / Self Care | Payer: Medicare Other | Attending: Emergency Medicine | Admitting: Emergency Medicine

## 2015-06-21 ENCOUNTER — Telehealth: Payer: Self-pay | Admitting: Internal Medicine

## 2015-06-21 DIAGNOSIS — Z7901 Long term (current) use of anticoagulants: Secondary | ICD-10-CM | POA: Insufficient documentation

## 2015-06-21 DIAGNOSIS — Y9389 Activity, other specified: Secondary | ICD-10-CM | POA: Diagnosis not present

## 2015-06-21 DIAGNOSIS — Z7951 Long term (current) use of inhaled steroids: Secondary | ICD-10-CM | POA: Diagnosis not present

## 2015-06-21 DIAGNOSIS — I10 Essential (primary) hypertension: Secondary | ICD-10-CM | POA: Diagnosis not present

## 2015-06-21 DIAGNOSIS — S79911A Unspecified injury of right hip, initial encounter: Secondary | ICD-10-CM | POA: Diagnosis not present

## 2015-06-21 DIAGNOSIS — Z79899 Other long term (current) drug therapy: Secondary | ICD-10-CM | POA: Insufficient documentation

## 2015-06-21 DIAGNOSIS — Y998 Other external cause status: Secondary | ICD-10-CM | POA: Insufficient documentation

## 2015-06-21 DIAGNOSIS — Z87891 Personal history of nicotine dependence: Secondary | ICD-10-CM | POA: Insufficient documentation

## 2015-06-21 DIAGNOSIS — Y9289 Other specified places as the place of occurrence of the external cause: Secondary | ICD-10-CM | POA: Diagnosis not present

## 2015-06-21 DIAGNOSIS — M25551 Pain in right hip: Secondary | ICD-10-CM

## 2015-06-21 DIAGNOSIS — W1839XA Other fall on same level, initial encounter: Secondary | ICD-10-CM | POA: Insufficient documentation

## 2015-06-21 LAB — URINALYSIS COMPLETE WITH MICROSCOPIC (ARMC ONLY)
BACTERIA UA: NONE SEEN
BILIRUBIN URINE: NEGATIVE
Glucose, UA: 50 mg/dL — AB
Ketones, ur: NEGATIVE mg/dL
LEUKOCYTES UA: NEGATIVE
Nitrite: NEGATIVE
PH: 5 (ref 5.0–8.0)
Protein, ur: 100 mg/dL — AB
Specific Gravity, Urine: 1.01 (ref 1.005–1.030)
Squamous Epithelial / LPF: NONE SEEN

## 2015-06-21 MED ORDER — HYDROCODONE-ACETAMINOPHEN 5-325 MG PO TABS
1.0000 | ORAL_TABLET | Freq: Once | ORAL | Status: AC
  Administered 2015-06-21: 1 via ORAL
  Filled 2015-06-21: qty 1

## 2015-06-21 NOTE — Discharge Instructions (Signed)
Please seek medical attention for any high fevers, chest pain, shortness of breath, change in behavior, persistent vomiting, bloody stool or any other new or concerning symptoms. ° ° °Fall Prevention and Home Safety °Falls cause injuries and can affect all age groups. It is possible to use preventive measures to significantly decrease the likelihood of falls. There are many simple measures which can make your home safer and prevent falls. °OUTDOORS °· Repair cracks and edges of walkways and driveways. °· Remove high doorway thresholds. °· Trim shrubbery on the main path into your home. °· Have good outside lighting. °· Clear walkways of tools, rocks, debris, and clutter. °· Check that handrails are not broken and are securely fastened. Both sides of steps should have handrails. °· Have leaves, snow, and ice cleared regularly. °· Use sand or salt on walkways during winter months. °· In the garage, clean up grease or oil spills. °BATHROOM °· Install night lights. °· Install grab bars by the toilet and in the tub and shower. °· Use non-skid mats or decals in the tub or shower. °· Place a plastic non-slip stool in the shower to sit on, if needed. °· Keep floors dry and clean up all water on the floor immediately. °· Remove soap buildup in the tub or shower on a regular basis. °· Secure bath mats with non-slip, double-sided rug tape. °· Remove throw rugs and tripping hazards from the floors. °BEDROOMS °· Install night lights. °· Make sure a bedside light is easy to reach. °· Do not use oversized bedding. °· Keep a telephone by your bedside. °· Have a firm chair with side arms to use for getting dressed. °· Remove throw rugs and tripping hazards from the floor. °KITCHEN °· Keep handles on pots and pans turned toward the center of the stove. Use back burners when possible. °· Clean up spills quickly and allow time for drying. °· Avoid walking on wet floors. °· Avoid hot utensils and knives. °· Position shelves so they are  not too high or low. °· Place commonly used objects within easy reach. °· If necessary, use a sturdy step stool with a grab bar when reaching. °· Keep electrical cables out of the way. °· Do not use floor polish or wax that makes floors slippery. If you must use wax, use non-skid floor wax. °· Remove throw rugs and tripping hazards from the floor. °STAIRWAYS °· Never leave objects on stairs. °· Place handrails on both sides of stairways and use them. Fix any loose handrails. Make sure handrails on both sides of the stairways are as long as the stairs. °· Check carpeting to make sure it is firmly attached along stairs. Make repairs to worn or loose carpet promptly. °· Avoid placing throw rugs at the top or bottom of stairways, or properly secure the rug with carpet tape to prevent slippage. Get rid of throw rugs, if possible. °· Have an electrician put in a light switch at the top and bottom of the stairs. °OTHER FALL PREVENTION TIPS °· Wear low-heel or rubber-soled shoes that are supportive and fit well. Wear closed toe shoes. °· When using a stepladder, make sure it is fully opened and both spreaders are firmly locked. Do not climb a closed stepladder. °· Add color or contrast paint or tape to grab bars and handrails in your home. Place contrasting color strips on first and last steps. °· Learn and use mobility aids as needed. Install an electrical emergency response system. °· Turn on lights   to avoid dark areas. Replace light bulbs that burn out immediately. Get light switches that glow. °· Arrange furniture to create clear pathways. Keep furniture in the same place. °· Firmly attach carpet with non-skid or double-sided tape. °· Eliminate uneven floor surfaces. °· Select a carpet pattern that does not visually hide the edge of steps. °· Be aware of all pets. °OTHER HOME SAFETY TIPS °· Set the water temperature for 120° F (48.8° C). °· Keep emergency numbers on or near the telephone. °· Keep smoke detectors on  every level of the home and near sleeping areas. °Document Released: 10/19/2002 Document Revised: 04/29/2012 Document Reviewed: 01/18/2012 °Exit[employer]® Patient Information ©2015 [employer]. This information is not intended to replace advice given to you by your health care provider. Make sure you discuss any questions you have with your health care provider. ° °

## 2015-06-21 NOTE — Telephone Encounter (Signed)
From encounter note appears pt at [facility] ED.

## 2015-06-21 NOTE — ED Notes (Signed)
Pt comes into the ED via EMS from [facility]..states he called out for help to get up this morning and no one came states he attempted to get up on his own and right leg gave out folding behind himself.the patient c/o right hip, knee and ankle pain.the patient had recent right hip fx for which he was at [facility] for rehab.[name]

## 2015-06-21 NOTE — ED Notes (Signed)
Attempted to call [facility] to let them know patient was returning.  No answer. Daughter is aware patient is returning.

## 2015-06-21 NOTE — ED Provider Notes (Signed)
[facility] Emergency Department Provider Note   ____________________________________________  Time seen: 54  I have reviewed the triage vital signs and the nursing notes.   HISTORY  Chief Complaint Fall   History limited by: Not Limited   HPI Chad Simpson is a 81 y.o. male who presents to the emergency department today after a fall. Patient is currently at a nursing home after a recent right hip fracture and repair. Patient states that when he fell he felt like he had his right leg behind him. He is now complaining of pain primarily at the right hip. He states that he had been dialing well after the surgery. With the fall he denies any head trauma. Denies any midline neck pain. Denies any pain in his upper extremities. Additionally he denies any recent fevers, chest pain, shortness of breath.     Past Medical History  Diagnosis Date  . Anemia 04/27/2015  . Cancer   . Hypertension   . COPD (chronic obstructive pulmonary disease)   . High cholesterol     Patient Active Problem List   Diagnosis Date Noted  . Femoral neck fracture 06/12/2015  . Anemia 04/27/2015    Past Surgical History  Procedure Laterality Date  . Prostatectomy    . Appendectomy    . Tonsillectomy    . Back surgery    . Hip arthroplasty Right 06/13/2015    Procedure: ARTHROPLASTY BIPOLAR HIP (HEMIARTHROPLASTY);  Surgeon: Dereck Leep, MD;  Location: [facility] ORS;  Service: Orthopedics;  Laterality: Right;    Current Outpatient Rx  Name  Route  Sig  Dispense  Refill  . amLODipine (NORVASC) 10 MG tablet   Oral   Take 10 mg by mouth daily.          [name] apixaban (ELIQUIS) 2.5 MG TABS tablet   Oral   Take 1 tablet (2.5 mg total) by mouth 2 (two) times daily. X 32 days. Discontinue on 07/15/15   60 tablet   o   . budesonide-formoterol (SYMBICORT) 160-4.5 MCG/ACT inhaler   Inhalation   Inhale 2 puffs into the lungs daily.          . calcitRIOL (ROCALTROL) 0.25 MCG  capsule   Oral   Take 0.25 mcg by mouth. Mondays, Wednesdays, and Fridays.         [name] docusate sodium (COLACE) 100 MG capsule   Oral   Take 100 mg by mouth daily as needed. Three times a week as needed for constipation.         . ferrous fumarate-iron polysaccharide complex (TANDEM) 162-115.2 MG CAPS      1 tablet on Monday, Wednesday, and Friday   45 capsule   1   . FLUoxetine (PROZAC) 20 MG capsule   Oral   Take 20 mg by mouth daily.          . hydrALAZINE (APRESOLINE) 10 MG tablet   Oral   Take 10 mg by mouth 2 (two) times daily.         . hydroxychloroquine (PLAQUENIL) 200 MG tablet   Oral   Take 200 mg by mouth daily. Mondays, Wednesdays, and Fridays.         . irbesartan (AVAPRO) 300 MG tablet   Oral   Take 300 mg by mouth daily.          . meclizine (ANTIVERT) 12.5 MG tablet   Oral   Take 12.5 mg by mouth at bedtime as needed for dizziness.          [name]  metoprolol (LOPRESSOR) 100 MG tablet   Oral   Take 100 mg by mouth 2 (two) times daily with a meal.          . OLANZapine (ZYPREXA) 2.5 MG tablet   Oral   Take 2.5 mg by mouth daily.          [name] oxyCODONE (OXY IR/ROXICODONE) 5 MG immediate release tablet   Oral   Take 1-2 tablets (5-10 mg total) by mouth every 4 (four) hours as needed for breakthrough pain ((for MODERATE breakthrough pain)).   30 tablet   0   . rosuvastatin (CRESTOR) 10 MG tablet   Oral   Take 10 mg by mouth daily.         [name] tiotropium (SPIRIVA) 18 MCG inhalation capsule   Inhalation   Place 18 mcg into inhaler and inhale daily.          . traMADol (ULTRAM) 50 MG tablet   Oral   Take 1-2 tablets (50-100 mg total) by mouth every 4 (four) hours as needed for moderate pain.   30 tablet   0     Allergies Ciprofloxacin and Erythromycin  Family History  Problem Relation Age of Onset  . Heart failure Neg Hx     Social History History  Substance Use Topics  . Smoking status: Former Research scientist (life sciences)  . Smokeless  tobacco: Not on file  . Alcohol Use: No    Review of Systems  Constitutional: Negative for fever. Cardiovascular: Negative for chest pain. Respiratory: Negative for shortness of breath. Gastrointestinal: Negative for abdominal pain, vomiting and diarrhea. Genitourinary: Negative for dysuria. Musculoskeletal: Negative for back pain. Positive for right hip pain Skin: Negative for rash. Neurological: Negative for headaches, focal weakness or numbness.  10-point ROS otherwise negative.  ____________________________________________   PHYSICAL EXAM:  VITAL SIGNS:   98.4 F (36.9 C)  92  18   170/69 mmHg  98 %     Constitutional: Alert and oriented. Well appearing and in no distress. Eyes: Conjunctivae are normal. PERRL. Normal extraocular movements. ENT   Head: Normocephalic and atraumatic.   Nose: No congestion/rhinnorhea.   Mouth/Throat: Mucous membranes are moist.   Neck: No stridor. Hematological/Lymphatic/Immunilogical: No cervical lymphadenopathy. Cardiovascular: Normal rate, regular rhythm.  No murmurs, rubs, or gallops. Respiratory: Normal respiratory effort without tachypnea nor retractions. Breath sounds are clear and equal bilaterally. No wheezes/rales/rhonchi. Gastrointestinal: Soft and nontender. No distention. There is no CVA tenderness. Genitourinary: Deferred Musculoskeletal: Right hip bandage still in place. Mild tenderness to palpation. Neurologic:  Normal speech and language. No gross focal neurologic deficits are appreciated. Speech is normal.  Skin:  Skin is warm, dry and intact. No rash noted. Psychiatric: Mood and affect are normal. Speech and behavior are normal. Patient exhibits appropriate insight and judgment.  ____________________________________________    LABS (pertinent positives/negatives)  Labs Reviewed  URINALYSIS COMPLETEWITH MICROSCOPIC (Sharon) - Abnormal; Notable for the following:    Color, Urine STRAW (*)     APPearance CLEAR (*)    Glucose, UA 50 (*)    Hgb urine dipstick 1+ (*)    Protein, ur 100 (*)    All other components within normal limits     ____________________________________________   EKG  None  ____________________________________________    RADIOLOGY  Right hip x-ray IMPRESSION: Stable postoperative appearance of the right hip from recent femoral arthroplasty. No acute fracture or dislocation identified. ____________________________________________   PROCEDURES  Procedure(s) performed: None  Critical Care performed: No  ____________________________________________  INITIAL IMPRESSION / ASSESSMENT AND PLAN / ED COURSE  Pertinent labs & imaging results that were available during my care of the patient were reviewed by me and considered in my medical decision making (see chart for details).  Patient presents to the emergency department today because of concerns for right hip pain after a fall. X-ray does not show any acute fracture dislocation. Given the patient was recently postop from a right femur fracture think likely pain is secondary to contusions and postop. Additionally urine was checked given following her and this did not show any indications of urinary tract infection. Will discharge back to facility.  ____________________________________________   FINAL CLINICAL IMPRESSION(S) / ED DIAGNOSES  Final diagnoses:  Hip pain, right     Nance Pear, MD 06/21/15 1253

## 2015-06-21 NOTE — Telephone Encounter (Signed)
Will follow up at [facility] He has no good safety awareness and frequent falls

## 2015-06-21 NOTE — Telephone Encounter (Signed)
Patient Name: Chad Simpson DOB: 1930/04/20 Initial Comment Ivin Booty w/ [facility] - pt fell, and c/o leg pain and right hip pain. there for rehab - fractured hip Nurse Assessment Nurse: Vallery Sa, RN, [place] Date/Time Eilene Ghazi Time): 06/21/2015 7:12:29 AM Confirm and document reason for call. If symptomatic, describe symptoms. ---Ivin Booty states Dawsen fell while going to the bathroom at 7am and is complaining of right hip and leg pain. No cuts to the skin. Has the patient traveled out of the country within the last 30 days? ---No Does the patient require triage? ---Yes Related visit to physician within the last 2 weeks? ---No Does the PT have any chronic conditions? (i.e. diabetes, asthma, etc.) ---Yes List chronic conditions. ---Right Fractured hip, Kidney Disease, High Blood Pressure Guidelines Guideline Title Affirmed Question Affirmed Notes Leg Injury Can't stand (bear weight) or walk Final Disposition User Call EMS 911 Now Vallery Sa, RN, [facility] - ED Disagree/Comply: Comply

## 2015-06-21 NOTE — ED Notes (Signed)
EMS called for transport.

## 2015-06-22 ENCOUNTER — Other Ambulatory Visit: Payer: Medicare Other

## 2015-06-22 ENCOUNTER — Ambulatory Visit: Payer: Medicare Other

## 2015-07-07 ENCOUNTER — Ambulatory Visit: Payer: Self-pay | Admitting: Psychiatry

## 2015-07-08 DIAGNOSIS — R142 Eructation: Secondary | ICD-10-CM

## 2015-07-08 DIAGNOSIS — K5900 Constipation, unspecified: Secondary | ICD-10-CM | POA: Diagnosis not present

## 2015-07-20 ENCOUNTER — Inpatient Hospital Stay (HOSPITAL_BASED_OUTPATIENT_CLINIC_OR_DEPARTMENT_OTHER): Payer: Medicare Other | Admitting: Family Medicine

## 2015-07-20 ENCOUNTER — Inpatient Hospital Stay: Payer: Medicare Other | Attending: Family Medicine

## 2015-07-20 ENCOUNTER — Inpatient Hospital Stay: Payer: Medicare Other

## 2015-07-20 ENCOUNTER — Encounter: Payer: Self-pay | Admitting: Family Medicine

## 2015-07-20 VITALS — BP 140/61 | HR 51 | Temp 97.6°F | Resp 18 | Wt 143.0 lb

## 2015-07-20 DIAGNOSIS — Z8781 Personal history of (healed) traumatic fracture: Secondary | ICD-10-CM | POA: Diagnosis not present

## 2015-07-20 DIAGNOSIS — R5383 Other fatigue: Secondary | ICD-10-CM | POA: Insufficient documentation

## 2015-07-20 DIAGNOSIS — I129 Hypertensive chronic kidney disease with stage 1 through stage 4 chronic kidney disease, or unspecified chronic kidney disease: Secondary | ICD-10-CM | POA: Diagnosis not present

## 2015-07-20 DIAGNOSIS — D631 Anemia in chronic kidney disease: Secondary | ICD-10-CM | POA: Insufficient documentation

## 2015-07-20 DIAGNOSIS — Z87891 Personal history of nicotine dependence: Secondary | ICD-10-CM | POA: Diagnosis not present

## 2015-07-20 DIAGNOSIS — R5381 Other malaise: Secondary | ICD-10-CM

## 2015-07-20 DIAGNOSIS — I10 Essential (primary) hypertension: Secondary | ICD-10-CM | POA: Diagnosis not present

## 2015-07-20 DIAGNOSIS — J449 Chronic obstructive pulmonary disease, unspecified: Secondary | ICD-10-CM | POA: Insufficient documentation

## 2015-07-20 DIAGNOSIS — Z79899 Other long term (current) drug therapy: Secondary | ICD-10-CM | POA: Diagnosis not present

## 2015-07-20 DIAGNOSIS — N189 Chronic kidney disease, unspecified: Secondary | ICD-10-CM | POA: Diagnosis not present

## 2015-07-20 DIAGNOSIS — Z9181 History of falling: Secondary | ICD-10-CM | POA: Insufficient documentation

## 2015-07-20 DIAGNOSIS — D72829 Elevated white blood cell count, unspecified: Secondary | ICD-10-CM | POA: Insufficient documentation

## 2015-07-20 DIAGNOSIS — R7989 Other specified abnormal findings of blood chemistry: Secondary | ICD-10-CM | POA: Insufficient documentation

## 2015-07-20 DIAGNOSIS — R531 Weakness: Secondary | ICD-10-CM

## 2015-07-20 DIAGNOSIS — M25551 Pain in right hip: Secondary | ICD-10-CM

## 2015-07-20 DIAGNOSIS — E780 Pure hypercholesterolemia: Secondary | ICD-10-CM | POA: Insufficient documentation

## 2015-07-20 DIAGNOSIS — D509 Iron deficiency anemia, unspecified: Secondary | ICD-10-CM

## 2015-07-20 DIAGNOSIS — Z8546 Personal history of malignant neoplasm of prostate: Secondary | ICD-10-CM | POA: Diagnosis not present

## 2015-07-20 DIAGNOSIS — D508 Other iron deficiency anemias: Secondary | ICD-10-CM

## 2015-07-20 LAB — HEMOGLOBIN: Hemoglobin: 6.2 g/dL — ABNORMAL LOW (ref 13.0–18.0)

## 2015-07-20 LAB — PREPARE RBC (CROSSMATCH)

## 2015-07-20 MED ORDER — EPOETIN ALFA 40000 UNIT/ML IJ SOLN: 40000.0000 [IU] | Freq: Once | INTRAMUSCULAR | Status: DC

## 2015-07-20 NOTE — Progress Notes (Unsigned)
Pt's daughter reports that her father had a partial hip replacement three weeks ago after a fall; then he fell again at rehab a week later. Also, that he hasn't received a Procrit injection in 3 weeks; and is also concerned about his iron level.Marland KitchenMarland KitchenHas been on Oxycontin since his procedure, but it tends to make him dizzy. Has a follow up visit with the surgeon, Dr. Dina Rich, on Tuesday.Marland KitchenMarland Kitchen

## 2015-07-20 NOTE — Progress Notes (Signed)
[facility]  [phone]  [phone]     BRANCH PACITTI DOB: December 13, 1929  MR#: [id number]  LPF#:[id number]  Patient Care Team: Leeroy Cha as PCP - General (Internal Medicine)  CHIEF COMPLAINT:  Patient has significant past medical history anemia, hgb 8.7 on 07/15/14, attrubute tto azotemia, cre was 3.0, egfr 18, renal u/s consistent with medical renal disease recently iron sat, ferritin, spep normal, normal urine, normal light chains documented. She was previously on Aranesp while residing in [place]. Positive ana and anti ds dna abs. Also noted to have borderline neutrophilia likely spurrious normal on 09/29/14. As well as a history of prostate cancer.  INTERVAL HISTORY:  Patient is here today as an acute add on. Scheduled for Procrit injection in infusion when hemoglobin returned at 6.2. Patient states that he is benefiting his normal state of health for the last month or so other than right hip pain which is status post right hip hemiarthroplasty on 06/14/2015. Original right femoral fracture occurred on July 31. Patient was then transferred to [facility] for short-term rehabilitation where he sustained another fall with pain of the right hip and leg. X-rays confirmed there was no acute injury. He is now at home and recuperating with PT. He states that he has not been any more fatigued or weak in the last several weeks. But has been having right hip pain.  REVIEW OF SYSTEMS:   Review of Systems  Constitutional: Positive for malaise/fatigue. Negative for fever, chills, weight loss and diaphoresis.  HENT: Negative for congestion, ear discharge, ear pain, hearing loss, nosebleeds, sore throat and tinnitus.   Eyes: Negative for blurred vision, double vision, photophobia, pain, discharge and redness.  Respiratory: Negative for cough, hemoptysis, sputum production, shortness of breath, wheezing and stridor.   Cardiovascular: Negative for chest pain,  palpitations, orthopnea, claudication, leg swelling and PND.  Gastrointestinal: Negative for heartburn, nausea, vomiting, abdominal pain, diarrhea, constipation, blood in stool and melena.  Genitourinary: Negative.   Musculoskeletal: Positive for joint pain and falls.       Pain and right here in all, status post hip fracture repair  Skin: Negative.   Neurological: Positive for weakness. Negative for dizziness, tingling, focal weakness, seizures and headaches.  Endo/Heme/Allergies: Does not bruise/bleed easily.  Psychiatric/Behavioral: Negative for depression. The patient is not nervous/anxious and does not have insomnia.     As per HPI. Otherwise, a complete review of systems is negatve.   PAST MEDICAL HISTORY: Past Medical History  Diagnosis Date  . Anemia 04/27/2015  . Cancer   . Hypertension   . COPD (chronic obstructive pulmonary disease)   . High cholesterol     PAST SURGICAL HISTORY: Past Surgical History  Procedure Laterality Date  . Prostatectomy    . Appendectomy    . Tonsillectomy    . Back surgery    . Hip arthroplasty Right 06/13/2015    Procedure: ARTHROPLASTY BIPOLAR HIP (HEMIARTHROPLASTY);  Surgeon: Dereck Leep, MD;  Location: [facility] ORS;  Service: Orthopedics;  Laterality: Right;    FAMILY HISTORY Family History  Problem Relation Age of Onset  . Heart failure Neg Hx     GYNECOLOGIC HISTORY:  No LMP for male patient.     ADVANCED DIRECTIVES:    HEALTH MAINTENANCE: Social History  Substance Use Topics  . Smoking status: Former Research scientist (life sciences)  . Smokeless tobacco: Not on file  . Alcohol Use: No     Colonoscopy:  PAP:  Bone density:  Lipid panel:  Allergies  Allergen Reactions  . Ciprofloxacin Nausea Only  . Erythromycin Nausea Only    Current Outpatient Prescriptions  Medication Sig Dispense Refill  . amLODipine (NORVASC) 10 MG tablet Take 10 mg by mouth daily.     [name] apixaban (ELIQUIS) 2.5 MG TABS tablet Take 1 tablet (2.5 mg total) by mouth 2  (two) times daily. X 32 days. Discontinue on 07/15/15 (Patient taking differently: Take 2.5 mg by mouth 2 (two) times daily. X 32 days. Discontinue on 06/27/15) 60 tablet o  . budesonide-formoterol (SYMBICORT) 160-4.5 MCG/ACT inhaler Inhale 2 puffs into the lungs daily.     . calcitRIOL (ROCALTROL) 0.25 MCG capsule Take 0.25 mcg by mouth. Mondays, Wednesdays, and Fridays.    . ferrous fumarate-iron polysaccharide complex (TANDEM) 162-115.2 MG CAPS 1 tablet on Monday, Wednesday, and Friday (Patient taking differently: Take 1 capsule by mouth once a week. 1 tablet on Monday) 45 capsule 1  . FLUoxetine (PROZAC) 20 MG capsule Take 20 mg by mouth daily.     . irbesartan (AVAPRO) 300 MG tablet Take 300 mg by mouth daily.     . meclizine (ANTIVERT) 12.5 MG tablet Take 12.5 mg by mouth at bedtime as needed for dizziness.     [name] oxyCODONE (OXY IR/ROXICODONE) 5 MG immediate release tablet Take 1-2 tablets (5-10 mg total) by mouth every 4 (four) hours as needed for breakthrough pain ((for MODERATE breakthrough pain)). (Patient taking differently: Take 5 mg by mouth every 4 (four) hours as needed for moderate pain or breakthrough pain. ) 30 tablet 0  . rosuvastatin (CRESTOR) 10 MG tablet Take 10 mg by mouth daily.    [name] senna (SENOKOT) 8.6 MG tablet Take 2 tablets by mouth daily.    [name] tiotropium (SPIRIVA) 18 MCG inhalation capsule Place 18 mcg into inhaler and inhale daily.     . traMADol (ULTRAM) 50 MG tablet Take 1-2 tablets (50-100 mg total) by mouth every 4 (four) hours as needed for moderate pain. 30 tablet 0   No current facility-administered medications for this visit.   Facility-Administered Medications Ordered in Other Visits  Medication Dose Route Frequency Provider Last Rate Last Dose  . ceFAZolin (ANCEF) IVPB 2 g/50 mL premix  2 g Intravenous Once Eugenie Filler, MD      . epoetin alfa (EPOGEN,PROCRIT) injection 40,000 Units  40,000 Units Subcutaneous Once Evlyn Kanner, NP   40,000 Units at 07/20/15  1305    OBJECTIVE: There were no vitals taken for this visit.   There is no weight on file to calculate BMI.    ECOG FS:2 - Symptomatic, <50% confined to bed  General: Well-developed, well-nourished, no acute distress. Pale appearing Eyes: Pale conjunctiva, anicteric sclera. HEENT: Normocephalic, moist mucous membranes, clear oropharnyx. Lungs: Clear to auscultation bilaterally. Heart: Regular rate and rhythm. No rubs, murmurs, or gallops. Abdomen: Soft, nontender, nondistended. No organomegaly noted, normoactive bowel sounds. Musculoskeletal: No edema, cyanosis, or clubbing. Patient reports increasing pain in right hip following surgery. Neuro: Alert, answering all questions appropriately. Cranial nerves grossly intact. Skin: No rashes or petechiae noted. Psych: Normal affect.    LAB RESULTS:  Appointment on 07/20/2015  Component Date Value Ref Range Status  . Hemoglobin 07/20/2015 6.2* 13.0 - 18.0 g/dL Final    STUDIES: No results found.  ASSESSMENT:  Anemia  PLAN:   1. Anemia. Patient was previously on established regimen of Procrit 40,000 units every 3 weeks. Most recently right femoral fracture and hospitalizations patient has not received Procrit since July 2016.  This likely plays a large part his hemoglobin of 6.2. Discussed with patient and family and they are agreeable to blood transfusion of 2 units. We'll schedule this as soon as possible. We'll also have patient come and in approximately 2 weeks for reevaluation and consideration of Procrit at that time.  Patient has been scheduled for 2 units of packed blood cells on Friday, September 19.  Patient expressed understanding and was in agreement with this plan. He also understands that He can call clinic at any time with any questions, concerns, or complaints.   Dr. Oliva Bustard was available for consultation and review of plan of care for this patient.   Evlyn Kanner, NP   07/20/2015 1:40 PM

## 2015-07-21 ENCOUNTER — Ambulatory Visit: Payer: Self-pay | Admitting: Psychiatry

## 2015-07-22 ENCOUNTER — Inpatient Hospital Stay: Payer: Medicare Other

## 2015-07-22 ENCOUNTER — Ambulatory Visit: Payer: Medicare Other

## 2015-07-22 ENCOUNTER — Other Ambulatory Visit: Payer: Self-pay | Admitting: Family Medicine

## 2015-07-22 VITALS — BP 175/79 | HR 73 | Temp 96.4°F | Resp 18

## 2015-07-22 DIAGNOSIS — D508 Other iron deficiency anemias: Secondary | ICD-10-CM

## 2015-07-22 DIAGNOSIS — D631 Anemia in chronic kidney disease: Secondary | ICD-10-CM | POA: Diagnosis not present

## 2015-07-22 MED ORDER — SODIUM CHLORIDE 0.9 % IV SOLN
250.0000 mL | Freq: Once | INTRAVENOUS | Status: AC
  Filled 2015-07-22: qty 250

## 2015-07-22 MED ORDER — ACETAMINOPHEN 325 MG PO TABS
650.0000 mg | ORAL_TABLET | Freq: Once | ORAL | Status: AC
  Administered 2015-07-22: 650 mg via ORAL
  Filled 2015-07-22: qty 2

## 2015-07-22 MED ORDER — DIPHENHYDRAMINE HCL 25 MG PO CAPS
25.0000 mg | ORAL_CAPSULE | Freq: Once | ORAL | Status: AC
  Administered 2015-07-22: 25 mg via ORAL
  Filled 2015-07-22: qty 1

## 2015-07-23 LAB — TYPE AND SCREEN
ABO/RH(D): O POS
Antibody Screen: NEGATIVE
UNIT DIVISION: 0
Unit division: 0

## 2015-08-04 ENCOUNTER — Inpatient Hospital Stay (HOSPITAL_BASED_OUTPATIENT_CLINIC_OR_DEPARTMENT_OTHER): Payer: Medicare Other | Admitting: Internal Medicine

## 2015-08-04 ENCOUNTER — Other Ambulatory Visit: Payer: Medicare Other

## 2015-08-04 ENCOUNTER — Inpatient Hospital Stay: Payer: Medicare Other

## 2015-08-04 ENCOUNTER — Ambulatory Visit: Payer: Medicare Other

## 2015-08-04 VITALS — BP 180/70 | HR 59 | Temp 98.3°F | Resp 18 | Ht 67.0 in | Wt 147.7 lb

## 2015-08-04 DIAGNOSIS — D72829 Elevated white blood cell count, unspecified: Secondary | ICD-10-CM

## 2015-08-04 DIAGNOSIS — N189 Chronic kidney disease, unspecified: Secondary | ICD-10-CM | POA: Diagnosis not present

## 2015-08-04 DIAGNOSIS — D509 Iron deficiency anemia, unspecified: Secondary | ICD-10-CM

## 2015-08-04 DIAGNOSIS — I129 Hypertensive chronic kidney disease with stage 1 through stage 4 chronic kidney disease, or unspecified chronic kidney disease: Secondary | ICD-10-CM | POA: Diagnosis not present

## 2015-08-04 DIAGNOSIS — Z8546 Personal history of malignant neoplasm of prostate: Secondary | ICD-10-CM

## 2015-08-04 DIAGNOSIS — C61 Malignant neoplasm of prostate: Secondary | ICD-10-CM

## 2015-08-04 DIAGNOSIS — N184 Chronic kidney disease, stage 4 (severe): Secondary | ICD-10-CM | POA: Diagnosis not present

## 2015-08-04 DIAGNOSIS — J449 Chronic obstructive pulmonary disease, unspecified: Secondary | ICD-10-CM

## 2015-08-04 DIAGNOSIS — D631 Anemia in chronic kidney disease: Secondary | ICD-10-CM

## 2015-08-04 DIAGNOSIS — M80051D Age-related osteoporosis with current pathological fracture, right femur, subsequent encounter for fracture with routine healing: Secondary | ICD-10-CM | POA: Diagnosis not present

## 2015-08-04 DIAGNOSIS — Z79899 Other long term (current) drug therapy: Secondary | ICD-10-CM

## 2015-08-04 LAB — HEMOGLOBIN: HEMOGLOBIN: 9.2 g/dL — AB (ref 13.0–18.0)

## 2015-08-04 NOTE — Progress Notes (Signed)
Patient's blood pressure manually checked.  BP 180/70 left arm. Procrit must be held today. RN will call Dr. Leeroy Cha, PMD [phone]) for management of hypertension. Patient will be reassessed in 2 weeks for lab and possible procrit.  The patient's daughter Colletta [place] requested that RN call her at [phone] or [phone] to update daughter on conversation with Dr. Fara Olden.  At 1038 am, a call was placed to Dr. Fara Olden. Message given to Home Care Triage. Dr. Fara Olden will call the patient and the patient's daughter.

## 2015-08-04 NOTE — Progress Notes (Signed)
[place] OFFICE PROGRESS NOTE  Patient Care Team: Leeroy Cha as PCP - General (Internal Medicine)  HPI  SUMMARY of HEMATOLOGIC HISTORY:  # ANEMIA of CHRONIC RENAL DISEASE on PROCRIT 40K U q 3W  # Chronic mild leucocytosis  # ? 2006- Hx PROSTATE CA [facility]  # CKD [creat ~ 3.4/STAGE IV; Dr.Lateef; declined Dialysis]     INTERVAL HISTORY:  81 year old male patient with above history of anemia of chronic renal disease on Procrit visit for follow-up.  Patient's last blood transfusion was approximately 2 weeks ago when his hemoglobin dropped to 6. Patient had not received Procrit in recent hip surgery; patient had not received Procrit for the last 2 Months. Patient status post blood transfusion states his energy is improved.  Patient is still recuperating from his recent hip surgery. He is currently residing in the assisted living.  His appetite is good. Denies any blood in stools or black colored stools. He still goes around in a wheelchair in the assisted living.  REVIEW OF SYSTEMS:     10 point review of systems was done with the patient and are negative.  I have reviewed the past medical history, past surgical history, social history and family history with the patient and they are unchanged from previous note unless stated above.  ALLERGIES:  is allergic to ciprofloxacin and erythromycin.  MEDICATIONS:  Current Outpatient Prescriptions  Medication Sig Dispense Refill  . amLODipine (NORVASC) 10 MG tablet Take 10 mg by mouth daily.     . budesonide-formoterol (SYMBICORT) 160-4.5 MCG/ACT inhaler Inhale 2 puffs into the lungs daily.     . calcitRIOL (ROCALTROL) 0.25 MCG capsule Take 0.25 mcg by mouth. Mondays, Wednesdays, and Fridays.    . ferrous fumarate-iron polysaccharide complex (TANDEM) 162-115.2 MG CAPS 1 tablet on Monday, Wednesday, and Friday (Patient taking differently: Take 1 capsule by mouth once a week. 1 tablet on Monday)  45 capsule 1  . FLUoxetine (PROZAC) 20 MG capsule Take 20 mg by mouth daily.     . irbesartan (AVAPRO) 300 MG tablet Take 300 mg by mouth daily.     . meclizine (ANTIVERT) 12.5 MG tablet Take 12.5 mg by mouth at bedtime as needed for dizziness.     . metoprolol (LOPRESSOR) 50 MG tablet Take 50 mg by mouth 2 (two) times daily.    . rosuvastatin (CRESTOR) 10 MG tablet Take 10 mg by mouth daily.    [name] senna (SENOKOT) 8.6 MG tablet Take 2 tablets by mouth daily.    [name] tiotropium (SPIRIVA) 18 MCG inhalation capsule Place 18 mcg into inhaler and inhale daily.     . hydrALAZINE (APRESOLINE) 10 MG tablet Take 1 tablet by mouth daily.     No current facility-administered medications for this visit.   Facility-Administered Medications Ordered in Other Visits  Medication Dose Route Frequency Provider Last Rate Last Dose  . 0.9 %  sodium chloride infusion  250 mL Intravenous Once Evlyn Kanner, NP      . ceFAZolin (ANCEF) IVPB 2 g/50 mL premix  2 g Intravenous Once Eugenie Filler, MD        PHYSICAL EXAMINATION:   BP 180/70 mmHg  Pulse 59  Temp(Src) 98.3 F (36.8 C) (Tympanic)  Resp 18  Ht 5\' 7"  (1.702 m)  Wt 147 lb 11.3 oz (66.999 kg)  BMI 23.13 kg/m2  SpO2 99%  Filed Weights   08/04/15 0921  Weight: 147 lb 11.3 oz (66.999 kg)  GENERAL:alert, no distress and comfortable. EYES: normal, Conjunctiva are pink and non-injected, sclera clear OROPHARYNX:no exudate, no erythema and lips, buccal mucosa, and tongue normal  NECK: No thyromegaly LYMPH:  no palpable lymphadenopathy in the cervical, axillary or inguinal LUNGS: clear to auscultation with normal breathing effort; No Wheeze or crackles  Cardio-vascular- Regular Rate & Rythm and no murmurs and no lower extremity edema ABDOMEN:abdomen soft, non-tender and normal bowel sounds; No hepato-splenomegaly.  Musculoskeletal:no cyanosis of digits and no clubbing  NEURO: alert & oriented x 3 with fluent speech, no focal motor/sensory  deficits. SKIN: no skin rash   LABORATORY DATA:  I have reviewed the data as listed    Component Value Date/Time   NA 140 06/15/2015 0543   K 3.6 06/15/2015 0543   CL 116* 06/15/2015 0543   CO2 16* 06/15/2015 0543   GLUCOSE 98 06/15/2015 0543   BUN 43* 06/15/2015 0543   CREATININE 3.43* 06/15/2015 0543   CALCIUM 8.3* 06/15/2015 0543   GFRNONAA 15* 06/15/2015 0543   GFRAA 18* 06/15/2015 0543    No results found for: SPEP, UPEP  Lab Results  Component Value Date   WBC 11.5* 06/16/2015   NEUTROABS 5.5 09/29/2014   HGB 9.2* 08/04/2015   HCT 26.0* 06/16/2015   MCV 97.7 06/16/2015   PLT 166 06/16/2015      Chemistry      Component Value Date/Time   NA 140 06/15/2015 0543   K 3.6 06/15/2015 0543   CL 116* 06/15/2015 0543   CO2 16* 06/15/2015 0543   BUN 43* 06/15/2015 0543   CREATININE 3.43* 06/15/2015 0543      Component Value Date/Time   CALCIUM 8.3* 06/15/2015 0543       RADIOGRAPHIC STUDIES: I have personally reviewed the radiological images as listed and agreed with the findings in the report. No results found.   ASSESSMENT & PLAN:  # Anemia of chronic kidney disease- currently on Procrit 40,000 units every 3 weeks. Patient has not received Procrit since July 2016. Patient received blood transfusion 2 units approximately 10 days ago. Today hemoglobin is 9.2. Symptomatic improvement.  # With regards to Procrit apparently restart every 2 weeks; however today blood pressure is systolic 546. Recommend holding Procrit. Reevaluate in approximately 2 weeks for starting Procrit.  # Elevated Blood Pressure: Deferred to PCP; our office will contact PCPs office to manage the blood pressure.  Patient will follow-up with me in 6 weeks.    All questions were answered. The patient/family knows to call the clinic with any problems, questions or concerns. No barriers to learning was detected. & I spent 15 counseling the patient face to face. The total time spent in the  appointment was 30 minutes and more than 50% was on counseling and review of test results     Cammie Sickle, MD 08/04/2015 10:13 AM

## 2015-08-18 ENCOUNTER — Inpatient Hospital Stay: Payer: Medicare Other | Attending: Internal Medicine

## 2015-08-18 ENCOUNTER — Inpatient Hospital Stay: Payer: Medicare Other

## 2015-08-18 VITALS — BP 160/63

## 2015-08-18 DIAGNOSIS — I129 Hypertensive chronic kidney disease with stage 1 through stage 4 chronic kidney disease, or unspecified chronic kidney disease: Secondary | ICD-10-CM | POA: Insufficient documentation

## 2015-08-18 DIAGNOSIS — N189 Chronic kidney disease, unspecified: Secondary | ICD-10-CM | POA: Insufficient documentation

## 2015-08-18 DIAGNOSIS — D631 Anemia in chronic kidney disease: Secondary | ICD-10-CM | POA: Diagnosis not present

## 2015-08-18 LAB — HEMOGLOBIN: Hemoglobin: 8.1 g/dL — ABNORMAL LOW (ref 13.0–18.0)

## 2015-08-18 LAB — FERRITIN: FERRITIN: 359 ng/mL — AB (ref 24–336)

## 2015-08-18 MED ORDER — EPOETIN ALFA 40000 UNIT/ML IJ SOLN
40000.0000 [IU] | Freq: Once | INTRAMUSCULAR | Status: AC
  Administered 2015-08-18: 40000 [IU] via SUBCUTANEOUS
  Filled 2015-08-18: qty 1

## 2015-09-01 ENCOUNTER — Inpatient Hospital Stay: Payer: Medicare Other

## 2015-09-01 VITALS — BP 148/68 | HR 50 | Temp 97.0°F

## 2015-09-01 DIAGNOSIS — N189 Chronic kidney disease, unspecified: Principal | ICD-10-CM

## 2015-09-01 DIAGNOSIS — D631 Anemia in chronic kidney disease: Secondary | ICD-10-CM

## 2015-09-01 LAB — HEMOGLOBIN: Hemoglobin: 8.5 g/dL — ABNORMAL LOW (ref 13.0–18.0)

## 2015-09-01 MED ORDER — EPOETIN ALFA 40000 UNIT/ML IJ SOLN
40000.0000 [IU] | Freq: Once | INTRAMUSCULAR | Status: AC
  Administered 2015-09-01: 40000 [IU] via SUBCUTANEOUS
  Filled 2015-09-01: qty 1

## 2015-09-15 ENCOUNTER — Inpatient Hospital Stay: Payer: Medicare Other

## 2015-09-15 ENCOUNTER — Inpatient Hospital Stay: Payer: Medicare Other | Attending: Internal Medicine | Admitting: Internal Medicine

## 2015-09-15 ENCOUNTER — Other Ambulatory Visit: Payer: Self-pay | Admitting: *Deleted

## 2015-09-15 VITALS — BP 162/68 | HR 48 | Temp 97.1°F | Wt 134.3 lb

## 2015-09-15 DIAGNOSIS — Z9079 Acquired absence of other genital organ(s): Secondary | ICD-10-CM | POA: Diagnosis not present

## 2015-09-15 DIAGNOSIS — R231 Pallor: Secondary | ICD-10-CM | POA: Diagnosis not present

## 2015-09-15 DIAGNOSIS — D72829 Elevated white blood cell count, unspecified: Secondary | ICD-10-CM | POA: Diagnosis not present

## 2015-09-15 DIAGNOSIS — D631 Anemia in chronic kidney disease: Secondary | ICD-10-CM

## 2015-09-15 DIAGNOSIS — C61 Malignant neoplasm of prostate: Secondary | ICD-10-CM

## 2015-09-15 DIAGNOSIS — N189 Chronic kidney disease, unspecified: Secondary | ICD-10-CM

## 2015-09-15 DIAGNOSIS — I129 Hypertensive chronic kidney disease with stage 1 through stage 4 chronic kidney disease, or unspecified chronic kidney disease: Secondary | ICD-10-CM | POA: Diagnosis not present

## 2015-09-15 DIAGNOSIS — K0889 Other specified disorders of teeth and supporting structures: Secondary | ICD-10-CM | POA: Diagnosis not present

## 2015-09-15 DIAGNOSIS — Z79899 Other long term (current) drug therapy: Secondary | ICD-10-CM | POA: Diagnosis not present

## 2015-09-15 DIAGNOSIS — Z8546 Personal history of malignant neoplasm of prostate: Secondary | ICD-10-CM | POA: Diagnosis not present

## 2015-09-15 DIAGNOSIS — N184 Chronic kidney disease, stage 4 (severe): Secondary | ICD-10-CM | POA: Insufficient documentation

## 2015-09-15 LAB — COMPREHENSIVE METABOLIC PANEL
ALK PHOS: 83 U/L (ref 38–126)
ALT: 10 U/L — AB (ref 17–63)
ANION GAP: 4 — AB (ref 5–15)
AST: 14 U/L — ABNORMAL LOW (ref 15–41)
Albumin: 3.3 g/dL — ABNORMAL LOW (ref 3.5–5.0)
BUN: 67 mg/dL — ABNORMAL HIGH (ref 6–20)
CALCIUM: 8.4 mg/dL — AB (ref 8.9–10.3)
CHLORIDE: 114 mmol/L — AB (ref 101–111)
CO2: 18 mmol/L — ABNORMAL LOW (ref 22–32)
Creatinine, Ser: 4.01 mg/dL — ABNORMAL HIGH (ref 0.61–1.24)
GFR calc non Af Amer: 13 mL/min — ABNORMAL LOW (ref >60)
GFR, EST AFRICAN AMERICAN: 15 mL/min — AB (ref >60)
Glucose, Bld: 80 mg/dL (ref 65–99)
Potassium: 4.7 mmol/L (ref 3.5–5.1)
Sodium: 136 mmol/L (ref 135–145)
Total Bilirubin: 0.4 mg/dL (ref 0.3–1.2)
Total Protein: 6.4 g/dL — ABNORMAL LOW (ref 6.5–8.1)

## 2015-09-15 LAB — CBC WITH DIFFERENTIAL/PLATELET
BASOS PCT: 1 %
Basophils Absolute: 0.1 10*3/uL (ref 0–0.1)
Eosinophils Absolute: 0.1 10*3/uL (ref 0–0.7)
Eosinophils Relative: 1 %
HCT: 32.2 % — ABNORMAL LOW (ref 40.0–52.0)
Hemoglobin: 10.5 g/dL — ABNORMAL LOW (ref 13.0–18.0)
Lymphocytes Relative: 24 %
Lymphs Abs: 2.3 10*3/uL (ref 1.0–3.6)
MCH: 32.2 pg (ref 26.0–34.0)
MCHC: 32.5 g/dL (ref 32.0–36.0)
MCV: 99.1 fL (ref 80.0–100.0)
MONOS PCT: 4 %
Monocytes Absolute: 0.3 10*3/uL (ref 0.2–1.0)
Neutro Abs: 6.8 10*3/uL — ABNORMAL HIGH (ref 1.4–6.5)
Neutrophils Relative %: 70 %
Platelets: 275 10*3/uL (ref 150–440)
RBC: 3.25 MIL/uL — ABNORMAL LOW (ref 4.40–5.90)
RDW: 20.6 % — ABNORMAL HIGH (ref 11.5–14.5)
WBC: 9.5 10*3/uL (ref 3.8–10.6)

## 2015-09-15 LAB — PSA: PSA: <0.01 ng/mL (ref 0.00–4.00)

## 2015-09-15 LAB — SAMPLE TO BLOOD BANK

## 2015-09-15 MED ORDER — EPOETIN ALFA 40000 UNIT/ML IJ SOLN
40000.0000 [IU] | Freq: Once | INTRAMUSCULAR | Status: AC
  Administered 2015-09-15: 40000 [IU] via SUBCUTANEOUS
  Filled 2015-09-15: qty 1

## 2015-09-15 NOTE — Progress Notes (Signed)
Pt brought to exam room 4 via wheelchair, denies pain or discomfort.  Daughter in room with patient.

## 2015-09-15 NOTE — Progress Notes (Signed)
[place] OFFICE PROGRESS NOTE  Patient Care Team: Leeroy Cha as PCP - General (Internal Medicine)  HPI  SUMMARY of HEMATOLOGIC HISTORY:  # ANEMIA of CHRONIC RENAL DISEASE on PROCRIT 40K U q 3W  # Chronic mild leucocytosis  # ? 2006- Hx PROSTATE CA [facility]  # CKD [creat ~ 3.4/STAGE IV; Dr.Lateef; declined Dialysis]     INTERVAL HISTORY:  81 year old male patient with above history of anemia of chronic renal disease on Procrit visit for follow-up.  Patient is still recuperating from his recent hip surgery. He is currently residing in the assisted living. He has been getting Procrit every 2 weeks; however approximate 6 week ago Procrit was held because of elevated blood pressure. He feels the Procrit has helping him/more energy.  His appetite is good. Denies any blood in stools or black colored stools. He still goes around in a wheelchair in the assisted living.  REVIEW OF SYSTEMS:   10 point review of systems was done with the patient and are negative.  I have reviewed the past medical history, past surgical history, social history and family history with the patient and they are unchanged from previous note unless stated above.  ALLERGIES:  is allergic to ciprofloxacin and erythromycin.  MEDICATIONS:  Current Outpatient Prescriptions  Medication Sig Dispense Refill  . amLODipine (NORVASC) 10 MG tablet Take 10 mg by mouth daily.     . budesonide-formoterol (SYMBICORT) 160-4.5 MCG/ACT inhaler Inhale 2 puffs into the lungs daily.     . calcitRIOL (ROCALTROL) 0.25 MCG capsule Take 0.25 mcg by mouth. Mondays, Wednesdays, and Fridays.    . ferrous fumarate-iron polysaccharide complex (TANDEM) 162-115.2 MG CAPS 1 tablet on Monday, Wednesday, and Friday (Patient taking differently: Take 1 capsule by mouth once a week. 1 tablet on Monday) 45 capsule 1  . FLUoxetine (PROZAC) 20 MG capsule Take 20 mg by mouth daily.     . hydrALAZINE  (APRESOLINE) 10 MG tablet Take 1 tablet by mouth daily.    . irbesartan (AVAPRO) 300 MG tablet Take 300 mg by mouth daily.     . metoprolol (LOPRESSOR) 50 MG tablet Take 50 mg by mouth 2 (two) times daily.    . rosuvastatin (CRESTOR) 10 MG tablet Take 10 mg by mouth daily.    [name] senna (SENOKOT) 8.6 MG tablet Take 2 tablets by mouth daily.    [name] tiotropium (SPIRIVA) 18 MCG inhalation capsule Place 18 mcg into inhaler and inhale daily.      No current facility-administered medications for this visit.   Facility-Administered Medications Ordered in Other Visits  Medication Dose Route Frequency Provider Last Rate Last Dose  . 0.9 %  sodium chloride infusion  250 mL Intravenous Once Evlyn Kanner, NP      . ceFAZolin (ANCEF) IVPB 2 g/50 mL premix  2 g Intravenous Once Eugenie Filler, MD        PHYSICAL EXAMINATION:   BP 162/68 mmHg  Pulse 48  Temp(Src) 97.1 F (36.2 C) (Tympanic)  Wt 134 lb 4.2 oz (60.9 kg)  Filed Weights   09/15/15 1058  Weight: 134 lb 4.2 oz (60.9 kg)    GENERAL:alert, no distress and comfortable. He is in a wheelchair. Accompanied by his daughter. EYES: Positive for pallor. OROPHARYNX: poor dentition.  NECK: No thyromegaly LYMPH:  no palpable lymphadenopathy in the cervical, axillary or inguinal LUNGS: clear to auscultation with normal breathing effort; No Wheeze or crackles  Cardio-vascular- Regular Rate & Rythm and  no murmurs and no lower extremity edema ABDOMEN:abdomen soft, non-tender and normal bowel sounds; No hepato-splenomegaly.  Musculoskeletal:no cyanosis of digits and no clubbing  NEURO: alert & oriented x 3 with fluent speech, no focal motor/sensory deficits. SKIN: no skin rash   LABORATORY DATA:  I have reviewed the data as listed    Component Value Date/Time   NA 140 06/15/2015 0543   K 3.6 06/15/2015 0543   CL 116* 06/15/2015 0543   CO2 16* 06/15/2015 0543   GLUCOSE 98 06/15/2015 0543   BUN 43* 06/15/2015 0543   CREATININE 3.43*  06/15/2015 0543   CALCIUM 8.3* 06/15/2015 0543   GFRNONAA 15* 06/15/2015 0543   GFRAA 18* 06/15/2015 0543    No results found for: SPEP, UPEP  Lab Results  Component Value Date   WBC 11.5* 06/16/2015   NEUTROABS 5.5 09/29/2014   HGB 8.5* 09/01/2015   HCT 26.0* 06/16/2015   MCV 97.7 06/16/2015   PLT 166 06/16/2015      Chemistry      Component Value Date/Time   NA 140 06/15/2015 0543   K 3.6 06/15/2015 0543   CL 116* 06/15/2015 0543   CO2 16* 06/15/2015 0543   BUN 43* 06/15/2015 0543   CREATININE 3.43* 06/15/2015 0543      Component Value Date/Time   CALCIUM 8.3* 06/15/2015 0543       RADIOGRAPHIC STUDIES: I have personally reviewed the radiological images as listed and agreed with the findings in the report. No results found.   ASSESSMENT & PLAN:  # Anemia of chronic kidney disease- currently on Procrit 40,000 units every 3 weeks. Patient has not received Procrit since July 2016.   # Patient's most recent hemoglobin- on October 20 was 8.5. He has not needed any transfusions in the last 4-6 weeks. Labs from today are still pending.  I would recommend switching the Procrit to Aranesp which could possibly be given every 4 weeks.  Plan is to get H&H every 2 weeks.  # Elevated Blood Pressure- as per the daughter patient's blood pressures have been around 160s. I have changed the parameters for Procrit/Aranesp to be given unless the systolic blood pressures greater than 789 or diastolic is greater than 95.  #Patient will follow-up with me in 3 months or earlier as needed.    All questions were answered. The patient/family knows to call the clinic with any problems, questions or concerns. No barriers to learning was detected. & I spent 15 counseling the patient face to face. The total time spent in the appointment was 30 minutes and more than 50% was on counseling and review of test results     Cammie Sickle, MD 09/15/2015 11:15 AM

## 2015-09-16 ENCOUNTER — Other Ambulatory Visit: Payer: Self-pay | Admitting: Internal Medicine

## 2015-09-19 ENCOUNTER — Telehealth: Payer: Self-pay | Admitting: *Deleted

## 2015-09-19 DIAGNOSIS — D649 Anemia, unspecified: Secondary | ICD-10-CM

## 2015-09-19 MED ORDER — FERROUS FUM-IRON POLYSACCH 162-115.2 MG PO CAPS: ORAL_CAPSULE | ORAL | Status: AC

## 2015-09-19 NOTE — Telephone Encounter (Signed)
Escribed

## 2015-09-29 ENCOUNTER — Inpatient Hospital Stay: Payer: Medicare Other

## 2015-09-29 DIAGNOSIS — N189 Chronic kidney disease, unspecified: Principal | ICD-10-CM

## 2015-09-29 DIAGNOSIS — D631 Anemia in chronic kidney disease: Secondary | ICD-10-CM | POA: Diagnosis not present

## 2015-09-29 LAB — HEMOGLOBIN: HEMOGLOBIN: 10.9 g/dL — AB (ref 13.0–18.0)

## 2015-09-29 LAB — HEMATOCRIT: HCT: 33.5 % — ABNORMAL LOW (ref 40.0–52.0)

## 2015-10-13 ENCOUNTER — Inpatient Hospital Stay: Payer: Medicare Other | Attending: Internal Medicine

## 2015-10-13 ENCOUNTER — Inpatient Hospital Stay: Payer: Medicare Other

## 2015-10-13 VITALS — BP 156/63 | HR 48 | Temp 98.1°F

## 2015-10-13 DIAGNOSIS — D631 Anemia in chronic kidney disease: Secondary | ICD-10-CM | POA: Diagnosis present

## 2015-10-13 DIAGNOSIS — I129 Hypertensive chronic kidney disease with stage 1 through stage 4 chronic kidney disease, or unspecified chronic kidney disease: Secondary | ICD-10-CM | POA: Diagnosis not present

## 2015-10-13 DIAGNOSIS — N184 Chronic kidney disease, stage 4 (severe): Secondary | ICD-10-CM | POA: Diagnosis not present

## 2015-10-13 DIAGNOSIS — N189 Chronic kidney disease, unspecified: Principal | ICD-10-CM

## 2015-10-13 LAB — HEMATOCRIT: HCT: 30.2 % — ABNORMAL LOW (ref 40.0–52.0)

## 2015-10-13 LAB — HEMOGLOBIN: HEMOGLOBIN: 9.7 g/dL — AB (ref 13.0–18.0)

## 2015-10-13 MED ORDER — DARBEPOETIN ALFA 200 MCG/0.4ML IJ SOSY
200.0000 ug | PREFILLED_SYRINGE | Freq: Once | INTRAMUSCULAR | Status: AC
  Administered 2015-10-13: 200 ug via SUBCUTANEOUS
  Filled 2015-10-13: qty 0.4

## 2015-10-27 ENCOUNTER — Inpatient Hospital Stay: Payer: Medicare Other

## 2015-10-27 ENCOUNTER — Other Ambulatory Visit: Payer: Self-pay | Admitting: Internal Medicine

## 2015-10-27 VITALS — BP 152/79 | HR 56

## 2015-10-27 DIAGNOSIS — N189 Chronic kidney disease, unspecified: Principal | ICD-10-CM

## 2015-10-27 DIAGNOSIS — D631 Anemia in chronic kidney disease: Secondary | ICD-10-CM | POA: Diagnosis not present

## 2015-10-27 LAB — HEMATOCRIT: HCT: 31.4 % — ABNORMAL LOW (ref 40.0–52.0)

## 2015-10-27 LAB — HEMOGLOBIN: HEMOGLOBIN: 10.3 g/dL — AB (ref 13.0–18.0)

## 2015-10-27 MED ORDER — DARBEPOETIN ALFA 200 MCG/0.4ML IJ SOSY
200.0000 ug | PREFILLED_SYRINGE | Freq: Once | INTRAMUSCULAR | Status: AC
  Administered 2015-10-27: 200 ug via SUBCUTANEOUS
  Filled 2015-10-27: qty 0.4

## 2015-11-08 ENCOUNTER — Inpatient Hospital Stay: Payer: Medicare Other

## 2015-11-08 VITALS — BP 139/68 | HR 56 | Resp 20

## 2015-11-08 DIAGNOSIS — N189 Chronic kidney disease, unspecified: Principal | ICD-10-CM

## 2015-11-08 DIAGNOSIS — D631 Anemia in chronic kidney disease: Secondary | ICD-10-CM | POA: Diagnosis not present

## 2015-11-08 LAB — HEMOGLOBIN: Hemoglobin: 11.8 g/dL — ABNORMAL LOW (ref 13.0–18.0)

## 2015-11-08 LAB — HEMATOCRIT: HCT: 37.4 % — ABNORMAL LOW (ref 40.0–52.0)

## 2015-11-08 MED ORDER — DARBEPOETIN ALFA 200 MCG/0.4ML IJ SOSY
200.0000 ug | PREFILLED_SYRINGE | Freq: Once | INTRAMUSCULAR | Status: AC
  Administered 2015-11-08: 200 ug via SUBCUTANEOUS
  Filled 2015-11-08: qty 0.4

## 2015-11-10 ENCOUNTER — Inpatient Hospital Stay: Payer: Medicare Other

## 2015-12-16 ENCOUNTER — Inpatient Hospital Stay: Payer: Medicare Other | Attending: Internal Medicine | Admitting: Internal Medicine

## 2015-12-16 ENCOUNTER — Encounter: Payer: Self-pay | Admitting: Internal Medicine

## 2015-12-16 ENCOUNTER — Inpatient Hospital Stay: Payer: Medicare Other

## 2015-12-16 VITALS — BP 135/59 | HR 43 | Temp 96.9°F | Resp 18 | Ht 67.0 in | Wt 133.4 lb

## 2015-12-16 VITALS — BP 142/57 | HR 59 | Temp 96.5°F | Resp 18

## 2015-12-16 DIAGNOSIS — D631 Anemia in chronic kidney disease: Secondary | ICD-10-CM

## 2015-12-16 DIAGNOSIS — N189 Chronic kidney disease, unspecified: Principal | ICD-10-CM

## 2015-12-16 DIAGNOSIS — Z993 Dependence on wheelchair: Secondary | ICD-10-CM | POA: Diagnosis not present

## 2015-12-16 DIAGNOSIS — Z79899 Other long term (current) drug therapy: Secondary | ICD-10-CM | POA: Diagnosis not present

## 2015-12-16 DIAGNOSIS — Z8546 Personal history of malignant neoplasm of prostate: Secondary | ICD-10-CM | POA: Insufficient documentation

## 2015-12-16 DIAGNOSIS — Z9079 Acquired absence of other genital organ(s): Secondary | ICD-10-CM | POA: Insufficient documentation

## 2015-12-16 DIAGNOSIS — I129 Hypertensive chronic kidney disease with stage 1 through stage 4 chronic kidney disease, or unspecified chronic kidney disease: Secondary | ICD-10-CM | POA: Diagnosis not present

## 2015-12-16 DIAGNOSIS — D72829 Elevated white blood cell count, unspecified: Secondary | ICD-10-CM | POA: Insufficient documentation

## 2015-12-16 LAB — CREATININE, SERUM
Creatinine, Ser: 4.14 mg/dL — ABNORMAL HIGH (ref 0.61–1.24)
GFR calc non Af Amer: 12 mL/min — ABNORMAL LOW (ref >60)
GFR, EST AFRICAN AMERICAN: 14 mL/min — AB (ref >60)

## 2015-12-16 LAB — HEMATOCRIT: HCT: 32.1 % — ABNORMAL LOW (ref 40.0–52.0)

## 2015-12-16 LAB — HEMOGLOBIN: Hemoglobin: 10.7 g/dL — ABNORMAL LOW (ref 13.0–18.0)

## 2015-12-16 MED ORDER — DARBEPOETIN ALFA 200 MCG/0.4ML IJ SOSY
200.0000 ug | PREFILLED_SYRINGE | Freq: Once | INTRAMUSCULAR | Status: AC
  Administered 2015-12-16: 200 ug via SUBCUTANEOUS
  Filled 2015-12-16: qty 0.4

## 2015-12-16 NOTE — Progress Notes (Signed)
Pt here for anemia, not been feeling well, nausea for several weeks but Dr. Olin Pia not want to give him nausea meds that would interact with his meds.  Also dizzy upon getting up. B/p meds has been inc to help with b/p. Had a cold for a week. Got atb and it caused diarrhea and they stopped atb and has not taken tool softner for few days because of diarrhea.

## 2015-12-16 NOTE — Progress Notes (Signed)
[place] OFFICE PROGRESS NOTE  Patient Care Team: Leeroy Cha as PCP - General (Internal Medicine)  HPI  SUMMARY of HEMATOLOGIC HISTORY:  # ANEMIA of CHRONIC RENAL DISEASE on PROCRIT 40K U q 3W; DEC 2016- Aranesp 294mcg q 6 W  # Chronic mild leucocytosis  # ? 2006- Hx PROSTATE CA [facility]  # CKD [creat ~ 3.4/STAGE IV; Dr.Lateef; declined Dialysis]     INTERVAL HISTORY: Pt is a poor historian/ accompanied by his daughter.   82 year old male patient with above history of anemia of chronic renal disease on  Aranesp Every 6 weeks visit for follow-up.  As per the daughter patient had an episode of upper respiratory tract infection that was treated with antibiotics.  The antibiotics give him diarrhea-   That is resolved.   As per the daughter patient's blood pressures are better controlled-  PCP made changes to  Antihypertensives.   As per the daughter-  Sincerely declined from the  Renal function standpoint-  He is being  In the process of being transitioned to home hospice. This has not started yet. Patient is not aware. As per the daughter patient's Kidney function continues to decline/ he has declined  Hemodialysis. He has been discharge from the nephrology clinic.   He still goes around in a wheelchair in the assisted living.  REVIEW OF SYSTEMS:   10 point review of systems was done with the patient and are negative.  I have reviewed the past medical history, past surgical history, social history and family history with the patient and they are unchanged from previous note unless stated above.  ALLERGIES:  is allergic to ciprofloxacin and erythromycin.  MEDICATIONS:  Current Outpatient Prescriptions  Medication Sig Dispense Refill  . amLODipine (NORVASC) 10 MG tablet Take 10 mg by mouth daily.     . budesonide-formoterol (SYMBICORT) 160-4.5 MCG/ACT inhaler Inhale 2 puffs into the lungs daily.     . ferrous fumarate-iron polysaccharide  complex (TANDEM) 162-115.2 MG CAPS capsule 1 tablet on Monday, Wednesday, and Friday 45 capsule 5  . FLUoxetine (PROZAC) 20 MG capsule Take 20 mg by mouth daily.     . irbesartan (AVAPRO) 300 MG tablet Take 300 mg by mouth daily.     . metoprolol (LOPRESSOR) 50 MG tablet Take 50 mg by mouth 2 (two) times daily.    . rosuvastatin (CRESTOR) 10 MG tablet Take 10 mg by mouth daily.    [name] tiotropium (SPIRIVA) 18 MCG inhalation capsule Place 18 mcg into inhaler and inhale daily.     . calcitRIOL (ROCALTROL) 0.25 MCG capsule Take 0.25 mcg by mouth. Mondays, Wednesdays, and Fridays.    . hydrALAZINE (APRESOLINE) 10 MG tablet Take 1 tablet by mouth 3 (three) times daily.     [name] senna (SENOKOT) 8.6 MG tablet Take 2 tablets by mouth 3 (three) times a week.      No current facility-administered medications for this visit.   Facility-Administered Medications Ordered in Other Visits  Medication Dose Route Frequency Provider Last Rate Last Dose  . 0.9 %  sodium chloride infusion  250 mL Intravenous Once Evlyn Kanner, NP      . ceFAZolin (ANCEF) IVPB 2 g/50 mL premix  2 g Intravenous Once Eugenie Filler, MD        PHYSICAL EXAMINATION:   BP 135/59 mmHg  Pulse 43  Temp(Src) 96.9 F (36.1 C) (Tympanic)  Resp 18  Ht 5\' 7"  (1.702 m)  Wt 133 lb 6.1  oz (60.5 kg)  BMI 20.89 kg/m2  Filed Weights   12/16/15 0949  Weight: 133 lb 6.1 oz (60.5 kg)    GENERAL:alert, no distress and comfortable. He is in a wheelchair. Accompanied by his daughter. EYES: Positive for pallor. OROPHARYNX: poor dentition.  NECK: No thyromegaly LYMPH:  no palpable lymphadenopathy in the cervical, axillary or inguinal LUNGS: clear to auscultation with normal breathing effort; No Wheeze or crackles  Cardio-vascular- Regular Rate & Rythm and no murmurs and no lower extremity edema ABDOMEN:abdomen soft, non-tender and normal bowel sounds; No hepato-splenomegaly.  Musculoskeletal:no cyanosis of digits and no clubbing  NEURO:  alert & oriented x 3 with fluent speech, no focal motor/sensory deficits. SKIN: no skin rash   LABORATORY DATA:  I have reviewed the data as listed    Component Value Date/Time   NA 136 09/15/2015 1126   K 4.7 09/15/2015 1126   CL 114* 09/15/2015 1126   CO2 18* 09/15/2015 1126   GLUCOSE 80 09/15/2015 1126   BUN 67* 09/15/2015 1126   CREATININE 4.14* 12/16/2015 0921   CALCIUM 8.4* 09/15/2015 1126   PROT 6.4* 09/15/2015 1126   ALBUMIN 3.3* 09/15/2015 1126   AST 14* 09/15/2015 1126   ALT 10* 09/15/2015 1126   ALKPHOS 83 09/15/2015 1126   BILITOT 0.4 09/15/2015 1126   GFRNONAA 12* 12/16/2015 0921   GFRAA 14* 12/16/2015 0921    No results found for: SPEP, UPEP  Lab Results  Component Value Date   WBC 9.5 09/15/2015   NEUTROABS 6.8* 09/15/2015   HGB 10.7* 12/16/2015   HCT 32.1* 12/16/2015   MCV 99.1 09/15/2015   PLT 275 09/15/2015      Chemistry      Component Value Date/Time   NA 136 09/15/2015 1126   K 4.7 09/15/2015 1126   CL 114* 09/15/2015 1126   CO2 18* 09/15/2015 1126   BUN 67* 09/15/2015 1126   CREATININE 4.14* 12/16/2015 0921      Component Value Date/Time   CALCIUM 8.4* 09/15/2015 1126   ALKPHOS 83 09/15/2015 1126   AST 14* 09/15/2015 1126   ALT 10* 09/15/2015 1126   BILITOT 0.4 09/15/2015 1126       RADIOGRAPHIC STUDIES: I have personally reviewed the radiological images as listed and agreed with the findings in the report. No results found.   ASSESSMENT & PLAN:  # Anemia of chronic kidney disease-   Hemoglobin today is 10.7;  Proceed with  Aranesp today.  Plan every 6 weeks.   #  Hypertension- better controlled;  Okay with Aranesp.  #  Overall decline in health/ worsening renal dysfunction/ patient declined hemodialysis.-  Agree with hospice evaluation  Through PCP.    #   Hemoglobin and hematocrit/ iron studies/ creatinine in 6 weeks;  Follow with M.D. Hemoglobin and hematocrit in 3 months.  # 15 minutes face-to-face with the patient  discussing the above plan of care; more than 50% of time spent on natural history; counseling and coordination.     Cammie Sickle, MD 12/16/2015 9:55 AM

## 2016-01-27 ENCOUNTER — Inpatient Hospital Stay: Payer: Medicare Other

## 2016-01-27 ENCOUNTER — Inpatient Hospital Stay: Payer: Medicare Other | Attending: Internal Medicine

## 2016-01-27 VITALS — BP 118/58 | HR 57 | Temp 97.2°F

## 2016-01-27 DIAGNOSIS — D631 Anemia in chronic kidney disease: Secondary | ICD-10-CM | POA: Insufficient documentation

## 2016-01-27 DIAGNOSIS — D72829 Elevated white blood cell count, unspecified: Secondary | ICD-10-CM | POA: Diagnosis not present

## 2016-01-27 DIAGNOSIS — I129 Hypertensive chronic kidney disease with stage 1 through stage 4 chronic kidney disease, or unspecified chronic kidney disease: Secondary | ICD-10-CM | POA: Diagnosis not present

## 2016-01-27 DIAGNOSIS — N189 Chronic kidney disease, unspecified: Principal | ICD-10-CM

## 2016-01-27 LAB — IRON AND TIBC
Iron: 111 ug/dL (ref 45–182)
SATURATION RATIOS: 40 % — AB (ref 17.9–39.5)
TIBC: 276 ug/dL (ref 250–450)
UIBC: 165 ug/dL

## 2016-01-27 LAB — FERRITIN: Ferritin: 274 ng/mL (ref 24–336)

## 2016-01-27 LAB — CREATININE, SERUM
CREATININE: 4.92 mg/dL — AB (ref 0.61–1.24)
GFR calc non Af Amer: 10 mL/min — ABNORMAL LOW (ref >60)
GFR, EST AFRICAN AMERICAN: 11 mL/min — AB (ref >60)

## 2016-01-27 LAB — HEMOGLOBIN: Hemoglobin: 8.5 g/dL — ABNORMAL LOW (ref 13.0–18.0)

## 2016-01-27 LAB — HEMATOCRIT: HCT: 25.7 % — ABNORMAL LOW (ref 40.0–52.0)

## 2016-01-27 MED ORDER — DARBEPOETIN ALFA 200 MCG/0.4ML IJ SOSY
200.0000 ug | PREFILLED_SYRINGE | Freq: Once | INTRAMUSCULAR | Status: AC
  Administered 2016-01-27: 200 ug via SUBCUTANEOUS
  Filled 2016-01-27: qty 0.4

## 2016-02-22 ENCOUNTER — Inpatient Hospital Stay

## 2016-02-22 ENCOUNTER — Other Ambulatory Visit: Payer: Self-pay | Admitting: *Deleted

## 2016-02-22 ENCOUNTER — Inpatient Hospital Stay: Attending: Internal Medicine

## 2016-02-22 VITALS — BP 158/84 | HR 79

## 2016-02-22 DIAGNOSIS — N189 Chronic kidney disease, unspecified: Principal | ICD-10-CM

## 2016-02-22 DIAGNOSIS — Z9079 Acquired absence of other genital organ(s): Secondary | ICD-10-CM | POA: Insufficient documentation

## 2016-02-22 DIAGNOSIS — D72829 Elevated white blood cell count, unspecified: Secondary | ICD-10-CM | POA: Diagnosis not present

## 2016-02-22 DIAGNOSIS — Z8546 Personal history of malignant neoplasm of prostate: Secondary | ICD-10-CM | POA: Insufficient documentation

## 2016-02-22 DIAGNOSIS — I129 Hypertensive chronic kidney disease with stage 1 through stage 4 chronic kidney disease, or unspecified chronic kidney disease: Secondary | ICD-10-CM | POA: Diagnosis present

## 2016-02-22 DIAGNOSIS — D631 Anemia in chronic kidney disease: Secondary | ICD-10-CM

## 2016-02-22 LAB — HEMOGLOBIN: HEMOGLOBIN: 8.3 g/dL — AB (ref 13.0–18.0)

## 2016-02-22 LAB — SAMPLE TO BLOOD BANK

## 2016-02-22 LAB — HEMATOCRIT: HCT: 24.9 % — ABNORMAL LOW (ref 40.0–52.0)

## 2016-02-22 MED ORDER — DARBEPOETIN ALFA 200 MCG/0.4ML IJ SOSY
200.0000 ug | PREFILLED_SYRINGE | Freq: Once | INTRAMUSCULAR | Status: AC
  Administered 2016-02-22: 200 ug via SUBCUTANEOUS
  Filled 2016-02-22: qty 0.4

## 2016-02-29 ENCOUNTER — Other Ambulatory Visit: Payer: Self-pay | Admitting: *Deleted

## 2016-03-14 ENCOUNTER — Inpatient Hospital Stay (HOSPITAL_BASED_OUTPATIENT_CLINIC_OR_DEPARTMENT_OTHER): Admitting: Internal Medicine

## 2016-03-14 ENCOUNTER — Inpatient Hospital Stay: Attending: Internal Medicine

## 2016-03-14 ENCOUNTER — Telehealth: Payer: Self-pay | Admitting: *Deleted

## 2016-03-14 ENCOUNTER — Inpatient Hospital Stay

## 2016-03-14 VITALS — BP 138/67 | HR 52 | Temp 97.2°F | Resp 20 | Wt 135.6 lb

## 2016-03-14 DIAGNOSIS — Z79899 Other long term (current) drug therapy: Secondary | ICD-10-CM | POA: Diagnosis not present

## 2016-03-14 DIAGNOSIS — D72829 Elevated white blood cell count, unspecified: Secondary | ICD-10-CM | POA: Diagnosis not present

## 2016-03-14 DIAGNOSIS — I129 Hypertensive chronic kidney disease with stage 1 through stage 4 chronic kidney disease, or unspecified chronic kidney disease: Secondary | ICD-10-CM | POA: Diagnosis not present

## 2016-03-14 DIAGNOSIS — N184 Chronic kidney disease, stage 4 (severe): Secondary | ICD-10-CM | POA: Insufficient documentation

## 2016-03-14 DIAGNOSIS — D631 Anemia in chronic kidney disease: Secondary | ICD-10-CM

## 2016-03-14 DIAGNOSIS — Z8546 Personal history of malignant neoplasm of prostate: Secondary | ICD-10-CM | POA: Insufficient documentation

## 2016-03-14 DIAGNOSIS — Z9079 Acquired absence of other genital organ(s): Secondary | ICD-10-CM | POA: Diagnosis not present

## 2016-03-14 DIAGNOSIS — N189 Chronic kidney disease, unspecified: Secondary | ICD-10-CM

## 2016-03-14 DIAGNOSIS — D509 Iron deficiency anemia, unspecified: Secondary | ICD-10-CM

## 2016-03-14 DIAGNOSIS — K5900 Constipation, unspecified: Secondary | ICD-10-CM | POA: Insufficient documentation

## 2016-03-14 LAB — HEMATOCRIT: HEMATOCRIT: 26.5 % — AB (ref 40.0–52.0)

## 2016-03-14 LAB — HEMOGLOBIN: HEMOGLOBIN: 8.6 g/dL — AB (ref 13.0–18.0)

## 2016-03-14 LAB — CREATININE, SERUM
Creatinine, Ser: 4.94 mg/dL — ABNORMAL HIGH (ref 0.61–1.24)
GFR, EST AFRICAN AMERICAN: 11 mL/min — AB (ref >60)
GFR, EST NON AFRICAN AMERICAN: 10 mL/min — AB (ref >60)

## 2016-03-14 LAB — FERRITIN: Ferritin: 246 ng/mL (ref 24–336)

## 2016-03-14 LAB — IRON AND TIBC
Iron: 85 ug/dL (ref 45–182)
Saturation Ratios: 31 % (ref 17.9–39.5)
TIBC: 273 ug/dL (ref 250–450)
UIBC: 188 ug/dL

## 2016-03-14 MED ORDER — DARBEPOETIN ALFA 200 MCG/0.4ML IJ SOSY
200.0000 ug | PREFILLED_SYRINGE | Freq: Once | INTRAMUSCULAR | Status: AC
  Administered 2016-03-14: 200 ug via SUBCUTANEOUS
  Filled 2016-03-14: qty 0.4

## 2016-03-14 NOTE — Telephone Encounter (Signed)
I will enter zofran dose/drug on pt's med list for today.

## 2016-03-14 NOTE — Telephone Encounter (Signed)
States she was told to call back with dose of fathers med. Ondansetron 4 mg every 6 hours as needed, he takes 1 every night to help him sleep

## 2016-03-14 NOTE — Progress Notes (Signed)
[place] OFFICE PROGRESS NOTE  Patient Care Team: Leeroy Cha as PCP - General (Internal Medicine)  HPI  SUMMARY of HEMATOLOGIC HISTORY:  # ANEMIA of CHRONIC RENAL DISEASE on PROCRIT 40K U q 3W; DEC 2016- Aranesp 274mcg q 2 W  # Chronic mild leucocytosis  # ? 2006- Hx PROSTATE CA [facility]  # CKD [creat ~ 3.4/STAGE IV; Dr.Lateef; declined Dialysis]     INTERVAL HISTORY: Pt is a poor historian/ accompanied by his daughter.   82 year old male patient with above history of anemia of chronic renal disease  on  Aranesp Every 4 weeks visit for follow-up.He is currently in hospice.  Patient has been feeling tired./He feels dizzy on standing. Otherwise denies any unusual chest pain or cough. His chronic shortness of breath. Since patient declined dialysis he has been discharged from the nephrology clinic. He has intermittent constipation. No blood in stools.   He still goes around in a wheelchair in the assisted living.  REVIEW OF SYSTEMS:   10 point review of systems was done with the patient and are negative.  I have reviewed the past medical history, past surgical history, social history and family history with the patient and they are unchanged from previous note unless stated above.  ALLERGIES:  is allergic to ciprofloxacin and erythromycin.  MEDICATIONS:  Current Outpatient Prescriptions  Medication Sig Dispense Refill  . amLODipine (NORVASC) 10 MG tablet Take 10 mg by mouth daily.     . budesonide-formoterol (SYMBICORT) 160-4.5 MCG/ACT inhaler Inhale 2 puffs into the lungs daily.     . calcitRIOL (ROCALTROL) 0.25 MCG capsule Take 0.25 mcg by mouth. Mondays, Wednesdays, and Fridays.    . ferrous fumarate-iron polysaccharide complex (TANDEM) 162-115.2 MG CAPS capsule 1 tablet on Monday, Wednesday, and Friday 45 capsule 5  . FLUoxetine (PROZAC) 20 MG tablet     . hydrALAZINE (APRESOLINE) 10 MG tablet Take 1 tablet by mouth 3 (three) times  daily.     . irbesartan (AVAPRO) 300 MG tablet Take 300 mg by mouth daily.     . metoprolol (LOPRESSOR) 50 MG tablet Take 50 mg by mouth 2 (two) times daily.    . rosuvastatin (CRESTOR) 10 MG tablet Take 10 mg by mouth daily.    [name] senna (SENOKOT) 8.6 MG tablet Take 2 tablets by mouth 3 (three) times a week.      No current facility-administered medications for this visit.   Facility-Administered Medications Ordered in Other Visits  Medication Dose Route Frequency Provider Last Rate Last Dose  . 0.9 %  sodium chloride infusion  250 mL Intravenous Once Evlyn Kanner, NP      . ceFAZolin (ANCEF) IVPB 2 g/50 mL premix  2 g Intravenous Once Eugenie Filler, MD        PHYSICAL EXAMINATION:   BP 138/67 mmHg  Pulse 52  Temp(Src) 97.2 F (36.2 C) (Tympanic)  Wt 135 lb 9.3 oz (61.5 kg)  Filed Weights   03/14/16 1032  Weight: 135 lb 9.3 oz (61.5 kg)    GENERAL:alert, no distress and comfortable. He is in a wheelchair. Accompanied by his daughter. EYES: Positive for pallor. OROPHARYNX: poor dentition.  NECK: No thyromegaly LYMPH:  no palpable lymphadenopathy in the cervical, axillary or inguinal LUNGS: clear to auscultation with normal breathing effort; No Wheeze or crackles  Cardio-vascular- Regular Rate & Rythm and no murmurs and no lower extremity edema ABDOMEN:abdomen soft, non-tender and normal bowel sounds; No hepato-splenomegaly.  Musculoskeletal:no cyanosis of  digits and no clubbing  NEURO: alert & oriented x 3 with fluent speech, no focal motor/sensory deficits. SKIN: no skin rash   LABORATORY DATA:  I have reviewed the data as listed    Component Value Date/Time   NA 136 09/15/2015 1126   K 4.7 09/15/2015 1126   CL 114* 09/15/2015 1126   CO2 18* 09/15/2015 1126   GLUCOSE 80 09/15/2015 1126   BUN 67* 09/15/2015 1126   CREATININE 4.94* 03/14/2016 1003   CALCIUM 8.4* 09/15/2015 1126   PROT 6.4* 09/15/2015 1126   ALBUMIN 3.3* 09/15/2015 1126   AST 14* 09/15/2015 1126    ALT 10* 09/15/2015 1126   ALKPHOS 83 09/15/2015 1126   BILITOT 0.4 09/15/2015 1126   GFRNONAA 10* 03/14/2016 1003   GFRAA 11* 03/14/2016 1003    No results found for: SPEP, UPEP  Lab Results  Component Value Date   WBC 9.5 09/15/2015   NEUTROABS 6.8* 09/15/2015   HGB 8.6* 03/14/2016   HCT 26.5* 03/14/2016   MCV 99.1 09/15/2015   PLT 275 09/15/2015      Chemistry      Component Value Date/Time   NA 136 09/15/2015 1126   K 4.7 09/15/2015 1126   CL 114* 09/15/2015 1126   CO2 18* 09/15/2015 1126   BUN 67* 09/15/2015 1126   CREATININE 4.94* 03/14/2016 1003      Component Value Date/Time   CALCIUM 8.4* 09/15/2015 1126   ALKPHOS 83 09/15/2015 1126   AST 14* 09/15/2015 1126   ALT 10* 09/15/2015 1126   BILITOT 0.4 09/15/2015 1126       RADIOGRAPHIC STUDIES: I have personally reviewed the radiological images as listed and agreed with the findings in the report. No results found.   ASSESSMENT & PLAN:  # Anemia of chronic kidney disease-   Hemoglobin today is 8.7   Proceed with  Aranesp 200 g today. Continue every 4 weeks. Awaiting iron studies. Will plan IV iron- Feraheme 2- this might help the hemoglobin go up. However if it does not help would recommend increasing the dose of Aranesp. .  #  Hypertension- better controlled;  Okay with Aranesp.  # Patient is currently hospice/Continue H&H every 4 weeks; with aranesp q4 w; and f/u with MD in 3 months/cbc/bmp/iron studies.   # Above plan discussed with the patient and his daughter in detail.     Cammie Sickle, MD 03/14/2016 10:54 AM

## 2016-03-21 ENCOUNTER — Inpatient Hospital Stay

## 2016-03-21 VITALS — BP 168/61 | HR 55 | Temp 96.1°F | Resp 18

## 2016-03-21 DIAGNOSIS — D500 Iron deficiency anemia secondary to blood loss (chronic): Secondary | ICD-10-CM

## 2016-03-21 DIAGNOSIS — I129 Hypertensive chronic kidney disease with stage 1 through stage 4 chronic kidney disease, or unspecified chronic kidney disease: Secondary | ICD-10-CM | POA: Diagnosis not present

## 2016-03-21 MED ORDER — SODIUM CHLORIDE 0.9 % IV SOLN
Freq: Once | INTRAVENOUS | Status: AC
Start: 2016-03-21 — End: 2016-03-21
  Administered 2016-03-21: 20 mL/h via INTRAVENOUS
  Filled 2016-03-21: qty 1000

## 2016-03-21 MED ORDER — SODIUM CHLORIDE 0.9 % IV SOLN
510.0000 mg | Freq: Once | INTRAVENOUS | Status: AC
  Administered 2016-03-21: 510 mg via INTRAVENOUS
  Filled 2016-03-21: qty 17

## 2016-03-28 ENCOUNTER — Inpatient Hospital Stay

## 2016-03-28 VITALS — BP 132/74 | HR 55 | Resp 18

## 2016-03-28 DIAGNOSIS — I129 Hypertensive chronic kidney disease with stage 1 through stage 4 chronic kidney disease, or unspecified chronic kidney disease: Secondary | ICD-10-CM | POA: Diagnosis not present

## 2016-03-28 DIAGNOSIS — D500 Iron deficiency anemia secondary to blood loss (chronic): Secondary | ICD-10-CM

## 2016-03-28 MED ORDER — SODIUM CHLORIDE 0.9 % IV SOLN
510.0000 mg | Freq: Once | INTRAVENOUS | Status: AC
  Administered 2016-03-28: 510 mg via INTRAVENOUS
  Filled 2016-03-28: qty 17

## 2016-03-28 MED ORDER — SODIUM CHLORIDE 0.9 % IV SOLN
Freq: Once | INTRAVENOUS | Status: AC
Start: 2016-03-28 — End: 2016-03-28
  Administered 2016-03-28: 10:00:00 via INTRAVENOUS
  Filled 2016-03-28: qty 1000

## 2016-04-11 ENCOUNTER — Inpatient Hospital Stay

## 2016-04-11 VITALS — BP 136/69 | HR 55 | Temp 96.2°F

## 2016-04-11 DIAGNOSIS — N189 Chronic kidney disease, unspecified: Secondary | ICD-10-CM

## 2016-04-11 DIAGNOSIS — I129 Hypertensive chronic kidney disease with stage 1 through stage 4 chronic kidney disease, or unspecified chronic kidney disease: Secondary | ICD-10-CM | POA: Diagnosis not present

## 2016-04-11 DIAGNOSIS — D509 Iron deficiency anemia, unspecified: Secondary | ICD-10-CM

## 2016-04-11 DIAGNOSIS — D631 Anemia in chronic kidney disease: Secondary | ICD-10-CM

## 2016-04-11 LAB — HEMATOCRIT: HCT: 30 % — ABNORMAL LOW (ref 40.0–52.0)

## 2016-04-11 LAB — HEMOGLOBIN: Hemoglobin: 9.8 g/dL — ABNORMAL LOW (ref 13.0–18.0)

## 2016-04-11 MED ORDER — DARBEPOETIN ALFA 200 MCG/0.4ML IJ SOSY
200.0000 ug | PREFILLED_SYRINGE | Freq: Once | INTRAMUSCULAR | Status: AC
  Administered 2016-04-11: 200 ug via SUBCUTANEOUS
  Filled 2016-04-11: qty 0.4

## 2016-04-24 ENCOUNTER — Emergency Department
Admission: EM | Admit: 2016-04-24 | Discharge: 2016-04-24 | Disposition: A | Discharge status: Home / Self Care | Attending: Emergency Medicine | Admitting: Emergency Medicine

## 2016-04-24 ENCOUNTER — Emergency Department

## 2016-04-24 DIAGNOSIS — Z79899 Other long term (current) drug therapy: Secondary | ICD-10-CM | POA: Insufficient documentation

## 2016-04-24 DIAGNOSIS — W1800XA Striking against unspecified object with subsequent fall, initial encounter: Secondary | ICD-10-CM | POA: Insufficient documentation

## 2016-04-24 DIAGNOSIS — M25551 Pain in right hip: Secondary | ICD-10-CM | POA: Diagnosis present

## 2016-04-24 DIAGNOSIS — Y999 Unspecified external cause status: Secondary | ICD-10-CM | POA: Diagnosis not present

## 2016-04-24 DIAGNOSIS — S7001XA Contusion of right hip, initial encounter: Secondary | ICD-10-CM | POA: Insufficient documentation

## 2016-04-24 DIAGNOSIS — Y939 Activity, unspecified: Secondary | ICD-10-CM | POA: Diagnosis not present

## 2016-04-24 DIAGNOSIS — R52 Pain, unspecified: Secondary | ICD-10-CM

## 2016-04-24 DIAGNOSIS — N184 Chronic kidney disease, stage 4 (severe): Secondary | ICD-10-CM | POA: Diagnosis not present

## 2016-04-24 DIAGNOSIS — J449 Chronic obstructive pulmonary disease, unspecified: Secondary | ICD-10-CM | POA: Diagnosis not present

## 2016-04-24 DIAGNOSIS — Y929 Unspecified place or not applicable: Secondary | ICD-10-CM | POA: Diagnosis not present

## 2016-04-24 DIAGNOSIS — Z8546 Personal history of malignant neoplasm of prostate: Secondary | ICD-10-CM | POA: Insufficient documentation

## 2016-04-24 DIAGNOSIS — I129 Hypertensive chronic kidney disease with stage 1 through stage 4 chronic kidney disease, or unspecified chronic kidney disease: Secondary | ICD-10-CM | POA: Insufficient documentation

## 2016-04-24 DIAGNOSIS — Z87891 Personal history of nicotine dependence: Secondary | ICD-10-CM | POA: Insufficient documentation

## 2016-04-24 LAB — CBC WITH DIFFERENTIAL/PLATELET
BASOS PCT: 0 %
Basophils Absolute: 0 10*3/uL (ref 0–0.1)
EOS ABS: 0.2 10*3/uL (ref 0–0.7)
EOS PCT: 2 %
HCT: 30.1 % — ABNORMAL LOW (ref 40.0–52.0)
Hemoglobin: 9.4 g/dL — ABNORMAL LOW (ref 13.0–18.0)
Lymphocytes Relative: 16 %
Lymphs Abs: 1.5 10*3/uL (ref 1.0–3.6)
MCH: 32.7 pg (ref 26.0–34.0)
MCHC: 31.1 g/dL — AB (ref 32.0–36.0)
MCV: 104.9 fL — ABNORMAL HIGH (ref 80.0–100.0)
MONO ABS: 0.8 10*3/uL (ref 0.2–1.0)
MONOS PCT: 9 %
Neutro Abs: 7.2 10*3/uL — ABNORMAL HIGH (ref 1.4–6.5)
Neutrophils Relative %: 73 %
PLATELETS: 200 10*3/uL (ref 150–440)
RBC: 2.87 MIL/uL — ABNORMAL LOW (ref 4.40–5.90)
RDW: 20.8 % — AB (ref 11.5–14.5)
WBC: 9.8 10*3/uL (ref 3.8–10.6)

## 2016-04-24 LAB — BASIC METABOLIC PANEL
Anion gap: 8 (ref 5–15)
BUN: 67 mg/dL — ABNORMAL HIGH (ref 6–20)
CALCIUM: 8.7 mg/dL — AB (ref 8.9–10.3)
CO2: 16 mmol/L — AB (ref 22–32)
CREATININE: 6.05 mg/dL — AB (ref 0.61–1.24)
Chloride: 114 mmol/L — ABNORMAL HIGH (ref 101–111)
GFR calc Af Amer: 9 mL/min — ABNORMAL LOW (ref >60)
GFR calc non Af Amer: 8 mL/min — ABNORMAL LOW (ref >60)
GLUCOSE: 110 mg/dL — AB (ref 65–99)
Potassium: 4.2 mmol/L (ref 3.5–5.1)
Sodium: 138 mmol/L (ref 135–145)

## 2016-04-24 MED ORDER — HYDROCODONE-ACETAMINOPHEN 5-325 MG PO TABS: 1.0000 | ORAL_TABLET | Freq: Four times a day (QID) | ORAL | Status: AC | PRN

## 2016-04-24 MED ORDER — MORPHINE SULFATE (PF) 2 MG/ML IV SOLN
2.0000 mg | Freq: Once | INTRAVENOUS | Status: AC
  Administered 2016-04-24: 2 mg via INTRAVENOUS

## 2016-04-24 MED ORDER — MORPHINE SULFATE (PF) 2 MG/ML IV SOLN: 2.0000 mg | Freq: Once | INTRAVENOUS | Status: DC

## 2016-04-24 MED ORDER — MORPHINE SULFATE (PF) 2 MG/ML IV SOLN
2.0000 mg | Freq: Once | INTRAVENOUS | Status: DC
  Filled 2016-04-24 (×2): qty 1

## 2016-04-24 NOTE — ED Notes (Signed)
When questioned pt about pain he stated he was not in pain and did not want any morphine

## 2016-04-24 NOTE — ED Provider Notes (Signed)
Time Seen: Approximately *1700 I have reviewed the triage notes  Chief Complaint: Fall   History of Present Illness: Chad Simpson is a 82 y.o. male who presents via EMS from [facility]. Patient fell yesterday and states some right hip pain. He has been able to bear weight though was very uncomfortable with attempts at movement and ambulation. Patient has had previous surgery on his right hip and states it is natural for her to be slightly shortened. He apparently had some outpatient x-rays which we don't have access to the results that apparently showed an abnormality? Patient did strike his head but denies any loss of consciousness, nausea, vomiting, neck pain. He does have a DO NOT RESUSCITATE established.   Past Medical History  Diagnosis Date  . Anemia 04/27/2015  . Hypertension   . COPD (chronic obstructive pulmonary disease) ([place])   . High cholesterol   . Cancer [facility])     prostate    Patient Active Problem List   Diagnosis Date Noted  . CKD (chronic kidney disease), stage IV ([place]) 03/14/2016  . Anemia in chronic kidney disease 03/14/2016  . Femoral neck fracture ([place]) 06/12/2015  . Anemia 04/27/2015    Past Surgical History  Procedure Laterality Date  . Prostatectomy    . Appendectomy    . Tonsillectomy    . Back surgery    . Hip arthroplasty Right 06/13/2015    Procedure: ARTHROPLASTY BIPOLAR HIP (HEMIARTHROPLASTY);  Surgeon: Dereck Leep, MD;  Location: [facility] ORS;  Service: Orthopedics;  Laterality: Right;    Past Surgical History  Procedure Laterality Date  . Prostatectomy    . Appendectomy    . Tonsillectomy    . Back surgery    . Hip arthroplasty Right 06/13/2015    Procedure: ARTHROPLASTY BIPOLAR HIP (HEMIARTHROPLASTY);  Surgeon: Dereck Leep, MD;  Location: [facility] ORS;  Service: Orthopedics;  Laterality: Right;    Current Outpatient Rx  Name  Route  Sig  Dispense  Refill  . amLODipine (NORVASC) 10 MG tablet   Oral   Take 10 mg by  mouth daily.          . budesonide-formoterol (SYMBICORT) 160-4.5 MCG/ACT inhaler   Inhalation   Inhale 2 puffs into the lungs daily.          . calcitRIOL (ROCALTROL) 0.25 MCG capsule   Oral   Take 0.25 mcg by mouth. Mondays, Wednesdays, and Fridays.         . ferrous fumarate-iron polysaccharide complex (TANDEM) 162-115.2 MG CAPS capsule      1 tablet on Monday, Wednesday, and Friday   45 capsule   5   . FLUoxetine (PROZAC) 20 MG tablet               . hydrALAZINE (APRESOLINE) 10 MG tablet   Oral   Take 1 tablet by mouth 3 (three) times daily.          . irbesartan (AVAPRO) 300 MG tablet   Oral   Take 300 mg by mouth daily.          . metoprolol (LOPRESSOR) 50 MG tablet   Oral   Take 50 mg by mouth 2 (two) times daily.         . ondansetron (ZOFRAN) 4 MG tablet   Oral   Take 4 mg by mouth every 8 (eight) hours as needed for nausea or vomiting ("Ondansetron 4 mg every 6 hours as needed, he takes 1 every night  to help him sleep"). Reported on 03/14/2016         . rosuvastatin (CRESTOR) 10 MG tablet   Oral   Take 10 mg by mouth daily.         [name] senna (SENOKOT) 8.6 MG tablet   Oral   Take 2 tablets by mouth 3 (three) times a week.            Allergies:  Ciprofloxacin and Erythromycin  Family History: Family History  Problem Relation Age of Onset  . Heart failure Neg Hx   . Thyroid cancer Brother     Social History: Social History  Substance Use Topics  . Smoking status: Former Research scientist (life sciences)  . Smokeless tobacco: Never Used  . Alcohol Use: No     Review of Systems:   10 point review of systems was performed and was otherwise negative:  Constitutional: No fever Eyes: No visual disturbances ENT: No sore throat, ear pain Cardiac: No chest pain Respiratory: No shortness of breath, wheezing, or stridor Abdomen: No abdominal pain, no vomiting, No diarrhea Endocrine: No weight loss, No night sweats Extremities: No peripheral edema,  cyanosis Skin: No rashes, easy bruising Neurologic: No focal weakness, trouble with speech or swollowing Urologic: No dysuria, Hematuria, or urinary frequency Patient denies any syncopal episode and states that he simply lost his balance.  Physical Exam:  ED Triage Vitals  Enc Vitals Group     BP 04/24/16 1706 156/58 mmHg     Pulse Rate 04/24/16 1705 60     Resp 04/24/16 1706 18     Temp 04/24/16 1706 98.4 F (36.9 C)     Temp Source 04/24/16 1706 Oral     SpO2 04/24/16 1705 99 %     Weight 04/24/16 1706 135 lb (61.236 kg)     Height 04/24/16 1706 5\' 7"  (1.702 m)     Head Cir --      Peak Flow --      Pain Score 04/24/16 1707 1     Pain Loc --      Pain Edu? --      Excl. in [place]? --     General: Awake , Alert , and Oriented times 3; GCS 15 Head: Normal cephalic , atraumatic Eyes: Pupils equal , round, reactive to light Nose/Throat: No nasal drainage, patent upper airway without erythema or exudate.  Neck: Supple, Full range of motion, No anterior adenopathy or palpable thyroid masses Lungs: Clear to ascultation without wheezes , rhonchi, or rales Heart: Regular rate, regular rhythm without murmurs , gallops , or rubs Abdomen: Soft, non tender without rebound, guarding , or rigidity; bowel sounds positive and symmetric in all 4 quadrants. No organomegaly .        Extremities: Patient has pain with passive range of motion of the right hip with some slight shortening with no rotation the right lower extremity. The extremity is neurovascularly intact with good 2+ peripheral pulses. Neurologic: Patient has good plantar flexion and extension. Skin: warm, dry, no rashes No reproducible knee or ankle discomfort  Labs:   All laboratory work was reviewed including any pertinent negatives or positives listed below:  Labs Reviewed  BASIC METABOLIC PANEL  CBC WITH DIFFERENTIAL/PLATELET  Patient's labs appear to be at baseline for the patient with renal failure   Radiology:  *     CT Hip Right Wo Contrast (Final result) Result time: 04/24/16 19:35:42   Final result by Rad Results In Interface (04/24/16 19:35:42)   Narrative:  CLINICAL DATA: Status post fall, with worsening right hip tenderness and discomfort. Initial encounter.  EXAM: CT OF THE RIGHT HIP WITHOUT CONTRAST  TECHNIQUE: Multidetector CT imaging of the right hip was performed according to the standard protocol. Multiplanar CT image reconstructions were also generated.  COMPARISON: Right hip radiographs performed earlier today at 5:25 p.m.  FINDINGS: There is no evidence of fracture or dislocation. The patient's right hip arthroplasty appears grossly intact, without evidence of loosening. Mild heterotopic bone formation is noted about the proximal right femur. Evaluation is somewhat suboptimal due to metal artifact.  Vacuum phenomenon is noted at the right sacroiliac joint. Scattered vascular calcifications are seen. The visualized musculature is grossly unremarkable in appearance. Postoperative change is noted at the right hemipelvis.  IMPRESSION: 1. No evidence of fracture or dislocation. 2. Right hip arthroplasty appears grossly intact, without evidence of loosening. 3. Scattered vascular calcifications seen.   Electronically Signed By: Garald Balding M.D. On: 04/24/2016 19:35          DG Hip Unilat With Pelvis 2-3 Views Right (Final result) Result time: 04/24/16 17:47:46   Final result by Rad Results In Interface (04/24/16 17:47:46)   Narrative:   CLINICAL DATA: [place] yesterday morning, discomfort has become progressively worse today, RIGHT hip tenderness with palpation, can bear weight  EXAM: DG HIP (WITH OR WITHOUT PELVIS) 2-3V RIGHT  COMPARISON: 06/21/2015  FINDINGS: RIGHT hip prosthesis.  Diffuse osseous demineralization.  LEFT hip joint space preserved.  SI joints symmetric.  Degenerative disc and facet disease changes at visualized  lower lumbar spine.  New soft tissue calcification/ossification adjacent to the proximal RIGHT femur likely related to prior trauma and surgery.  No definite acute fracture, dislocation, or bone destruction.  IMPRESSION: No acute osseous abnormality.  Postsurgical changes of RIGHT hip arthroplasty with interval development of soft tissue calcification/ossification at the RIGHT hip region.   Electronically Signed By: Lavonia Dana M.D. On: 04/24/2016 17:47          I personally reviewed the radiologic studies     ED Course: * Patient's stay here was uneventful and he was eventually given morphine and had symptomatic relief. X-rays and CAT scan were negative for hip fracture and the patient symptomatically improved with the morphine. The family wished to take him home at this point. We will discharge him with pain control and advised that if he does not improve they can always return here for admission and rehabilitation, etc.    Assessment:  Right hip contusion  Final Clinical Impression: Right hip contusion Final diagnoses:  Pain     Plan: Outpatient management Patient was advised to return immediately if condition worsens. Patient was advised to follow up with their primary care physician or other specialized physicians involved in their outpatient care. The patient and/or family member/power of attorney had laboratory results reviewed at the bedside. All questions and concerns were addressed and appropriate discharge instructions were distributed by the nursing staff.             Daymon Larsen, MD 04/24/16 2325

## 2016-04-24 NOTE — ED Notes (Signed)
Pt from [facility] via EMS c/o Fall. Per EMS pt fell yesterday morning and had some mild discomfort that became progressively worse today. EMS states pt able to bear weight, but right hip tender upon palpation. Pt alert and oriented in no acute distress at this time. EMS VSS 146/60, HR 80, 98% RA, RR 16.

## 2016-04-27 ENCOUNTER — Other Ambulatory Visit: Payer: Self-pay | Admitting: *Deleted

## 2016-04-27 DIAGNOSIS — N189 Chronic kidney disease, unspecified: Principal | ICD-10-CM

## 2016-04-27 DIAGNOSIS — D631 Anemia in chronic kidney disease: Secondary | ICD-10-CM

## 2016-04-27 NOTE — Progress Notes (Signed)
Dr. Rogue Bussing received order from [place] making house calls  [facility] [place], [place]) that patient needs CMP q 4 months.  I spoke with Dr. Rogue Bussing, who approved CMP to be added to labs on 05/09/16.  Lab results will need to be faxed to [phone].

## 2016-05-09 ENCOUNTER — Inpatient Hospital Stay

## 2016-05-09 ENCOUNTER — Inpatient Hospital Stay: Attending: Internal Medicine

## 2016-05-09 VITALS — BP 157/72 | HR 57 | Temp 97.0°F | Resp 20

## 2016-05-09 DIAGNOSIS — D631 Anemia in chronic kidney disease: Secondary | ICD-10-CM

## 2016-05-09 DIAGNOSIS — I129 Hypertensive chronic kidney disease with stage 1 through stage 4 chronic kidney disease, or unspecified chronic kidney disease: Secondary | ICD-10-CM | POA: Insufficient documentation

## 2016-05-09 DIAGNOSIS — N189 Chronic kidney disease, unspecified: Secondary | ICD-10-CM

## 2016-05-09 DIAGNOSIS — D72829 Elevated white blood cell count, unspecified: Secondary | ICD-10-CM | POA: Insufficient documentation

## 2016-05-09 DIAGNOSIS — D509 Iron deficiency anemia, unspecified: Secondary | ICD-10-CM

## 2016-05-09 LAB — COMPREHENSIVE METABOLIC PANEL
ALT: 24 U/L (ref 17–63)
AST: 18 U/L (ref 15–41)
Albumin: 3.1 g/dL — ABNORMAL LOW (ref 3.5–5.0)
Alkaline Phosphatase: 121 U/L (ref 38–126)
Anion gap: 6 (ref 5–15)
BILIRUBIN TOTAL: 0.2 mg/dL — AB (ref 0.3–1.2)
BUN: 71 mg/dL — AB (ref 6–20)
CO2: 17 mmol/L — ABNORMAL LOW (ref 22–32)
CREATININE: 4.98 mg/dL — AB (ref 0.61–1.24)
Calcium: 9.2 mg/dL (ref 8.9–10.3)
Chloride: 114 mmol/L — ABNORMAL HIGH (ref 101–111)
GFR calc Af Amer: 11 mL/min — ABNORMAL LOW (ref >60)
GFR, EST NON AFRICAN AMERICAN: 10 mL/min — AB (ref >60)
Glucose, Bld: 107 mg/dL — ABNORMAL HIGH (ref 65–99)
Potassium: 4.1 mmol/L (ref 3.5–5.1)
Sodium: 137 mmol/L (ref 135–145)
TOTAL PROTEIN: 6.3 g/dL — AB (ref 6.5–8.1)

## 2016-05-09 LAB — HEMATOCRIT: HCT: 28.3 % — ABNORMAL LOW (ref 40.0–52.0)

## 2016-05-09 LAB — HEMOGLOBIN: Hemoglobin: 9.3 g/dL — ABNORMAL LOW (ref 13.0–18.0)

## 2016-05-09 MED ORDER — DARBEPOETIN ALFA 200 MCG/0.4ML IJ SOSY
200.0000 ug | PREFILLED_SYRINGE | Freq: Once | INTRAMUSCULAR | Status: AC
  Administered 2016-05-09: 200 ug via SUBCUTANEOUS
  Filled 2016-05-09: qty 0.4

## 2016-05-10 ENCOUNTER — Encounter: Payer: Self-pay | Admitting: *Deleted

## 2016-05-10 NOTE — Progress Notes (Signed)
H&H and cmp Labs routed to patient's pcp-Chad Simpson [phone]

## 2016-06-06 ENCOUNTER — Inpatient Hospital Stay

## 2016-06-12 ENCOUNTER — Inpatient Hospital Stay: Attending: Internal Medicine | Admitting: *Deleted

## 2016-06-12 ENCOUNTER — Other Ambulatory Visit: Payer: Self-pay

## 2016-06-12 ENCOUNTER — Inpatient Hospital Stay

## 2016-06-12 VITALS — BP 134/64 | HR 76

## 2016-06-12 DIAGNOSIS — D509 Iron deficiency anemia, unspecified: Secondary | ICD-10-CM

## 2016-06-12 DIAGNOSIS — D631 Anemia in chronic kidney disease: Secondary | ICD-10-CM

## 2016-06-12 DIAGNOSIS — D72829 Elevated white blood cell count, unspecified: Secondary | ICD-10-CM

## 2016-06-12 DIAGNOSIS — I129 Hypertensive chronic kidney disease with stage 1 through stage 4 chronic kidney disease, or unspecified chronic kidney disease: Secondary | ICD-10-CM

## 2016-06-12 DIAGNOSIS — N189 Chronic kidney disease, unspecified: Principal | ICD-10-CM

## 2016-06-12 LAB — CBC WITH DIFFERENTIAL/PLATELET
Basophils Absolute: 0.1 10*3/uL (ref 0–0.1)
Basophils Relative: 1 %
EOS ABS: 0.3 10*3/uL (ref 0–0.7)
Eosinophils Relative: 3 %
HCT: 28.8 % — ABNORMAL LOW (ref 40.0–52.0)
HEMOGLOBIN: 9.4 g/dL — AB (ref 13.0–18.0)
LYMPHS ABS: 2 10*3/uL (ref 1.0–3.6)
LYMPHS PCT: 18 %
MCH: 32.5 pg (ref 26.0–34.0)
MCHC: 32.5 g/dL (ref 32.0–36.0)
MCV: 100 fL (ref 80.0–100.0)
Monocytes Absolute: 0.4 10*3/uL (ref 0.2–1.0)
Monocytes Relative: 4 %
NEUTROS PCT: 76 %
Neutro Abs: 8.5 10*3/uL — ABNORMAL HIGH (ref 1.4–6.5)
Platelets: 215 10*3/uL (ref 150–440)
RBC: 2.88 MIL/uL — AB (ref 4.40–5.90)
RDW: 18.7 % — ABNORMAL HIGH (ref 11.5–14.5)
WBC: 11.3 10*3/uL — AB (ref 3.8–10.6)

## 2016-06-12 LAB — IRON AND TIBC
IRON: 183 ug/dL — AB (ref 45–182)
SATURATION RATIOS: 75 % — AB (ref 17.9–39.5)
TIBC: 245 ug/dL — AB (ref 250–450)
UIBC: 62 ug/dL

## 2016-06-12 LAB — FERRITIN: FERRITIN: 787 ng/mL — AB (ref 24–336)

## 2016-06-12 LAB — BASIC METABOLIC PANEL
ANION GAP: 4 — AB (ref 5–15)
BUN: 73 mg/dL — AB (ref 6–20)
CALCIUM: 9.2 mg/dL (ref 8.9–10.3)
CO2: 19 mmol/L — ABNORMAL LOW (ref 22–32)
CREATININE: 4.96 mg/dL — AB (ref 0.61–1.24)
Chloride: 114 mmol/L — ABNORMAL HIGH (ref 101–111)
GFR calc Af Amer: 11 mL/min — ABNORMAL LOW (ref >60)
GFR, EST NON AFRICAN AMERICAN: 10 mL/min — AB (ref >60)
GLUCOSE: 89 mg/dL (ref 65–99)
Potassium: 4.8 mmol/L (ref 3.5–5.1)
Sodium: 137 mmol/L (ref 135–145)

## 2016-06-12 MED ORDER — DARBEPOETIN ALFA 200 MCG/0.4ML IJ SOSY
200.0000 ug | PREFILLED_SYRINGE | Freq: Once | INTRAMUSCULAR | Status: AC
  Administered 2016-06-12: 200 ug via SUBCUTANEOUS
  Filled 2016-06-12: qty 0.4

## 2016-06-12 NOTE — Progress Notes (Signed)
BP is 164/64. MD approves giving dose.

## 2016-06-12 NOTE — Progress Notes (Signed)
Dr. Tish Men approves giving Procrit injection today with BP 134/64.

## 2016-06-14 ENCOUNTER — Ambulatory Visit: Payer: Medicare Other

## 2016-06-14 ENCOUNTER — Ambulatory Visit: Payer: Medicare Other | Admitting: Internal Medicine

## 2016-06-14 ENCOUNTER — Other Ambulatory Visit: Payer: Medicare Other

## 2016-07-03 ENCOUNTER — Other Ambulatory Visit: Payer: Self-pay

## 2016-07-03 DIAGNOSIS — N189 Chronic kidney disease, unspecified: Principal | ICD-10-CM

## 2016-07-03 DIAGNOSIS — D631 Anemia in chronic kidney disease: Secondary | ICD-10-CM

## 2016-07-04 ENCOUNTER — Ambulatory Visit: Payer: Medicare Other

## 2016-07-04 ENCOUNTER — Inpatient Hospital Stay

## 2016-07-04 ENCOUNTER — Inpatient Hospital Stay: Admitting: Internal Medicine

## 2016-07-04 VITALS — BP 146/72 | HR 50

## 2016-07-04 DIAGNOSIS — D631 Anemia in chronic kidney disease: Secondary | ICD-10-CM

## 2016-07-04 DIAGNOSIS — N189 Chronic kidney disease, unspecified: Principal | ICD-10-CM

## 2016-07-04 DIAGNOSIS — I129 Hypertensive chronic kidney disease with stage 1 through stage 4 chronic kidney disease, or unspecified chronic kidney disease: Secondary | ICD-10-CM | POA: Diagnosis not present

## 2016-07-04 LAB — HEMOGLOBIN AND HEMATOCRIT, BLOOD
HCT: 26.3 % — ABNORMAL LOW (ref 40.0–52.0)
Hemoglobin: 8.5 g/dL — ABNORMAL LOW (ref 13.0–18.0)

## 2016-07-04 MED ORDER — DARBEPOETIN ALFA 200 MCG/0.4ML IJ SOSY
200.0000 ug | PREFILLED_SYRINGE | Freq: Once | INTRAMUSCULAR | Status: AC
  Administered 2016-07-04: 200 ug via SUBCUTANEOUS
  Filled 2016-07-04: qty 0.4

## 2016-07-12 ENCOUNTER — Other Ambulatory Visit: Payer: Self-pay | Admitting: Internal Medicine

## 2016-07-23 ENCOUNTER — Inpatient Hospital Stay: Admitting: Internal Medicine

## 2016-07-23 ENCOUNTER — Inpatient Hospital Stay

## 2016-07-23 ENCOUNTER — Other Ambulatory Visit: Payer: Medicare Other

## 2016-07-23 ENCOUNTER — Ambulatory Visit: Payer: Medicare Other

## 2016-08-09 ENCOUNTER — Other Ambulatory Visit: Payer: Self-pay | Admitting: *Deleted

## 2016-08-09 ENCOUNTER — Inpatient Hospital Stay

## 2016-08-09 ENCOUNTER — Encounter: Payer: Self-pay | Admitting: Internal Medicine

## 2016-08-09 ENCOUNTER — Inpatient Hospital Stay: Attending: Internal Medicine | Admitting: Internal Medicine

## 2016-08-09 VITALS — BP 136/54 | HR 49 | Temp 97.0°F | Resp 19 | Ht 67.0 in | Wt 133.9 lb

## 2016-08-09 DIAGNOSIS — D631 Anemia in chronic kidney disease: Secondary | ICD-10-CM

## 2016-08-09 DIAGNOSIS — Z8546 Personal history of malignant neoplasm of prostate: Secondary | ICD-10-CM

## 2016-08-09 DIAGNOSIS — D72829 Elevated white blood cell count, unspecified: Secondary | ICD-10-CM | POA: Diagnosis not present

## 2016-08-09 DIAGNOSIS — I129 Hypertensive chronic kidney disease with stage 1 through stage 4 chronic kidney disease, or unspecified chronic kidney disease: Secondary | ICD-10-CM | POA: Diagnosis not present

## 2016-08-09 DIAGNOSIS — H938X3 Other specified disorders of ear, bilateral: Secondary | ICD-10-CM

## 2016-08-09 DIAGNOSIS — K089 Disorder of teeth and supporting structures, unspecified: Secondary | ICD-10-CM

## 2016-08-09 DIAGNOSIS — N184 Chronic kidney disease, stage 4 (severe): Secondary | ICD-10-CM

## 2016-08-09 DIAGNOSIS — Z9079 Acquired absence of other genital organ(s): Secondary | ICD-10-CM

## 2016-08-09 DIAGNOSIS — N189 Chronic kidney disease, unspecified: Principal | ICD-10-CM

## 2016-08-09 DIAGNOSIS — D508 Other iron deficiency anemias: Secondary | ICD-10-CM

## 2016-08-09 DIAGNOSIS — R5382 Chronic fatigue, unspecified: Secondary | ICD-10-CM

## 2016-08-09 DIAGNOSIS — Z79899 Other long term (current) drug therapy: Secondary | ICD-10-CM | POA: Diagnosis not present

## 2016-08-09 LAB — BASIC METABOLIC PANEL
Anion gap: 7 (ref 5–15)
BUN: 100 mg/dL — AB (ref 6–20)
CALCIUM: 8.7 mg/dL — AB (ref 8.9–10.3)
CHLORIDE: 117 mmol/L — AB (ref 101–111)
CO2: 14 mmol/L — AB (ref 22–32)
CREATININE: 5.62 mg/dL — AB (ref 0.61–1.24)
GFR calc non Af Amer: 8 mL/min — ABNORMAL LOW (ref >60)
GFR, EST AFRICAN AMERICAN: 10 mL/min — AB (ref >60)
GLUCOSE: 157 mg/dL — AB (ref 65–99)
Potassium: 4.7 mmol/L (ref 3.5–5.1)
Sodium: 138 mmol/L (ref 135–145)

## 2016-08-09 LAB — CBC WITH DIFFERENTIAL/PLATELET
BASOS PCT: 1 %
Basophils Absolute: 0.1 10*3/uL (ref 0–0.1)
Eosinophils Absolute: 0.2 10*3/uL (ref 0–0.7)
Eosinophils Relative: 2 %
HEMATOCRIT: 23.1 % — AB (ref 40.0–52.0)
HEMOGLOBIN: 7.6 g/dL — AB (ref 13.0–18.0)
LYMPHS PCT: 19 %
Lymphs Abs: 1.7 10*3/uL (ref 1.0–3.6)
MCH: 33.2 pg (ref 26.0–34.0)
MCHC: 32.7 g/dL (ref 32.0–36.0)
MCV: 101.5 fL — AB (ref 80.0–100.0)
Monocytes Absolute: 0.3 10*3/uL (ref 0.2–1.0)
Monocytes Relative: 3 %
NEUTROS ABS: 6.5 10*3/uL (ref 1.4–6.5)
Neutrophils Relative %: 75 %
Platelets: 209 10*3/uL (ref 150–440)
RBC: 2.27 MIL/uL — ABNORMAL LOW (ref 4.40–5.90)
RDW: 17.9 % — AB (ref 11.5–14.5)
WBC: 8.8 10*3/uL (ref 3.8–10.6)

## 2016-08-09 LAB — IRON AND TIBC
IRON: 118 ug/dL (ref 45–182)
Saturation Ratios: 51 % — ABNORMAL HIGH (ref 17.9–39.5)
TIBC: 231 ug/dL — ABNORMAL LOW (ref 250–450)
UIBC: 113 ug/dL

## 2016-08-09 LAB — FERRITIN: Ferritin: 514 ng/mL — ABNORMAL HIGH (ref 24–336)

## 2016-08-09 MED ORDER — DARBEPOETIN ALFA 300 MCG/0.6ML IJ SOSY
300.0000 ug | PREFILLED_SYRINGE | Freq: Once | INTRAMUSCULAR | Status: AC
  Administered 2016-08-09: 300 ug via SUBCUTANEOUS
  Filled 2016-08-09: qty 0.6

## 2016-08-09 NOTE — Progress Notes (Signed)
Pt complains of spitting up medium bright red blood and has been having this happen now for several months.  Hospice follows pt.

## 2016-08-09 NOTE — Assessment & Plan Note (Signed)
#   Anemia of chronic kidney disease-   Hemoglobin today is 7.3. Recommend Aranesp 300  Every 4 weeks; awaiting iron studies. Declines IV iron. Recommend PO iron BID.  #  Hypertension- better controlled;  Okay with Aranesp.  # Patient is currently hospice/Continue H&H every 4 weeks; with aranesp q4 w; and f/u with MD in 3 months/cbc/bmp/iron studies.   # Above plan discussed with the patient and his daughter in detail.

## 2016-08-09 NOTE — Progress Notes (Signed)
[place] OFFICE PROGRESS NOTE  Patient Care Team: Leeroy Cha, MD (Inactive) as PCP - General (Internal Medicine)  HPI  SUMMARY of HEMATOLOGIC HISTORY:  # ANEMIA of CHRONIC RENAL DISEASE on PROCRIT 40K U q 3W; DEC 2016- Aranesp 260mcg q 4 W  # Chronic mild leucocytosis  # ? 2006- Hx PROSTATE CA [[facility]; [facility]]  # CKD [creat ~ 3.4/STAGE IV; Dr.Lateef; declined Dialysis]     INTERVAL HISTORY: Pt is a poor historian/ accompanied by his daughter.   82 year old male patient with above history of anemia of chronic renal disease  on  Aranesp Every 4 weeks visit for follow-up.He is currently in hospice.  Patient denies any nausea vomiting. Complains of buzzing sound in the ears since this morning. Chronic fatigue. Denies any blood in stools black stools.    He still goes around in a wheelchair in the assisted living.  REVIEW OF SYSTEMS:   10 point review of systems was done with the patient and are negative.  I have reviewed the past medical history, past surgical history, social history and family history with the patient and they are unchanged from previous note unless stated above.  ALLERGIES:  is allergic to ciprofloxacin and erythromycin.  MEDICATIONS:  Current Outpatient Prescriptions  Medication Sig Dispense Refill  . amLODipine (NORVASC) 10 MG tablet Take 10 mg by mouth daily.     . budesonide-formoterol (SYMBICORT) 160-4.5 MCG/ACT inhaler Inhale 2 puffs into the lungs daily.     . ferrous fumarate-iron polysaccharide complex (TANDEM) 162-115.2 MG CAPS capsule 1 tablet on Monday, Wednesday, and Friday 45 capsule 5  . FLUoxetine (PROZAC) 20 MG tablet     . hydrALAZINE (APRESOLINE) 10 MG tablet Take 1 tablet by mouth 3 (three) times daily.     . metoprolol (LOPRESSOR) 50 MG tablet Take 50 mg by mouth 2 (two) times daily.    . ondansetron (ZOFRAN) 4 MG tablet Take 4 mg by mouth every 8 (eight) hours as needed for nausea or vomiting  ("Ondansetron 4 mg every 6 hours as needed, he takes 1 every night to help him sleep"). Reported on 03/14/2016    . senna (SENOKOT) 8.6 MG tablet Take 2 tablets by mouth 3 (three) times a week.     [name] HYDROcodone-acetaminophen (NORCO) 5-325 MG tablet Take 1 tablet by mouth every 6 (six) hours as needed for moderate pain. (Patient not taking: Reported on 08/09/2016) 20 tablet 0   No current facility-administered medications for this visit.    Facility-Administered Medications Ordered in Other Visits  Medication Dose Route Frequency Provider Last Rate Last Dose  . 0.9 %  sodium chloride infusion  250 mL Intravenous Once Evlyn Kanner, NP      . ceFAZolin (ANCEF) IVPB 2 g/50 mL premix  2 g Intravenous Once Eugenie Filler, MD        PHYSICAL EXAMINATION:   There were no vitals taken for this visit.  There were no vitals filed for this visit.  GENERAL:alert, no distress and comfortable. He is in a wheelchair. Accompanied by his daughter. EYES: Positive for pallor. OROPHARYNX: poor dentition.  NECK: No thyromegaly LYMPH:  no palpable lymphadenopathy in the cervical, axillary or inguinal LUNGS: clear to auscultation with normal breathing effort; No Wheeze or crackles  Cardio-vascular- Regular Rate & Rythm and no murmurs and no lower extremity edema ABDOMEN:abdomen soft, non-tender and normal bowel sounds; No hepato-splenomegaly.  Musculoskeletal:no cyanosis of digits and no clubbing  NEURO: alert & oriented x  3 with fluent speech, no focal motor/sensory deficits. SKIN: no skin rash   LABORATORY DATA:  I have reviewed the data as listed    Component Value Date/Time   NA 137 06/12/2016 1039   K 4.8 06/12/2016 1039   CL 114 (H) 06/12/2016 1039   CO2 19 (L) 06/12/2016 1039   GLUCOSE 89 06/12/2016 1039   BUN 73 (H) 06/12/2016 1039   CREATININE 4.96 (H) 06/12/2016 1039   CALCIUM 9.2 06/12/2016 1039   PROT 6.3 (L) 05/09/2016 1030   ALBUMIN 3.1 (L) 05/09/2016 1030   AST 18 05/09/2016 1030    ALT 24 05/09/2016 1030   ALKPHOS 121 05/09/2016 1030   BILITOT 0.2 (L) 05/09/2016 1030   GFRNONAA 10 (L) 06/12/2016 1039   GFRAA 11 (L) 06/12/2016 1039    No results found for: SPEP, UPEP  Lab Results  Component Value Date   WBC 8.8 08/09/2016   NEUTROABS 6.5 08/09/2016   HGB 7.6 (L) 08/09/2016   HCT 23.1 (L) 08/09/2016   MCV 101.5 (H) 08/09/2016   PLT 209 08/09/2016      Chemistry      Component Value Date/Time   NA 137 06/12/2016 1039   K 4.8 06/12/2016 1039   CL 114 (H) 06/12/2016 1039   CO2 19 (L) 06/12/2016 1039   BUN 73 (H) 06/12/2016 1039   CREATININE 4.96 (H) 06/12/2016 1039      Component Value Date/Time   CALCIUM 9.2 06/12/2016 1039   ALKPHOS 121 05/09/2016 1030   AST 18 05/09/2016 1030   ALT 24 05/09/2016 1030   BILITOT 0.2 (L) 05/09/2016 1030       RADIOGRAPHIC STUDIES: I have personally reviewed the radiological images as listed and agreed with the findings in the report. No results found.   ASSESSMENT & PLAN:  No problem-specific Assessment & Plan notes found for this encounter.       Cammie Sickle, MD 08/09/2016 2:03 PM

## 2016-08-15 ENCOUNTER — Telehealth: Payer: Self-pay | Admitting: *Deleted

## 2016-08-15 NOTE — Telephone Encounter (Signed)
RN Contacted Teressa back. Dr. Rogue Bussing would like tandem daily.  At this time, hospice nurse states that patient does not need any RFs on Tandem.

## 2016-08-15 NOTE — Telephone Encounter (Signed)
Daughter told her that he was instructed to take iron every day, she reports that he is on Tandem M-W-F already, please clarify if he is to take Tandem daily or are you adding another iron preparation

## 2016-09-06 ENCOUNTER — Inpatient Hospital Stay: Attending: Internal Medicine

## 2016-09-06 ENCOUNTER — Inpatient Hospital Stay

## 2016-09-06 VITALS — BP 128/76 | HR 76

## 2016-09-06 DIAGNOSIS — D631 Anemia in chronic kidney disease: Secondary | ICD-10-CM | POA: Insufficient documentation

## 2016-09-06 DIAGNOSIS — N184 Chronic kidney disease, stage 4 (severe): Secondary | ICD-10-CM | POA: Diagnosis not present

## 2016-09-06 DIAGNOSIS — D72829 Elevated white blood cell count, unspecified: Secondary | ICD-10-CM | POA: Diagnosis not present

## 2016-09-06 DIAGNOSIS — I129 Hypertensive chronic kidney disease with stage 1 through stage 4 chronic kidney disease, or unspecified chronic kidney disease: Secondary | ICD-10-CM | POA: Insufficient documentation

## 2016-09-06 DIAGNOSIS — N189 Chronic kidney disease, unspecified: Principal | ICD-10-CM

## 2016-09-06 DIAGNOSIS — D508 Other iron deficiency anemias: Secondary | ICD-10-CM

## 2016-09-06 LAB — CBC WITH DIFFERENTIAL/PLATELET
BASOS ABS: 0.1 10*3/uL (ref 0–0.1)
BASOS PCT: 1 %
EOS PCT: 2 %
Eosinophils Absolute: 0.2 10*3/uL (ref 0–0.7)
HEMATOCRIT: 25.2 % — AB (ref 40.0–52.0)
Hemoglobin: 8 g/dL — ABNORMAL LOW (ref 13.0–18.0)
LYMPHS PCT: 22 %
Lymphs Abs: 1.8 10*3/uL (ref 1.0–3.6)
MCH: 33.6 pg (ref 26.0–34.0)
MCHC: 32 g/dL (ref 32.0–36.0)
MCV: 105 fL — ABNORMAL HIGH (ref 80.0–100.0)
MONO ABS: 0.3 10*3/uL (ref 0.2–1.0)
Monocytes Relative: 3 %
NEUTROS ABS: 5.7 10*3/uL (ref 1.4–6.5)
Neutrophils Relative %: 72 %
PLATELETS: 214 10*3/uL (ref 150–440)
RBC: 2.4 MIL/uL — AB (ref 4.40–5.90)
RDW: 19.3 % — AB (ref 11.5–14.5)
WBC: 8 10*3/uL (ref 3.8–10.6)

## 2016-09-06 LAB — SAMPLE TO BLOOD BANK

## 2016-09-06 MED ORDER — DARBEPOETIN ALFA 300 MCG/0.6ML IJ SOSY
300.0000 ug | PREFILLED_SYRINGE | Freq: Once | INTRAMUSCULAR | Status: AC
  Administered 2016-09-06: 300 ug via SUBCUTANEOUS
  Filled 2016-09-06: qty 0.6

## 2016-09-09 IMAGING — CR DG KNEE COMPLETE 4+V*R*
4 series · 4 of 4 positions shown · non-contrast
Comparison: None.

CLINICAL DATA: Fall.  Trauma to the RIGHT knee.  Knee pain.

EXAM:
RIGHT KNEE - COMPLETE 4+ VIEW

[knee ap]
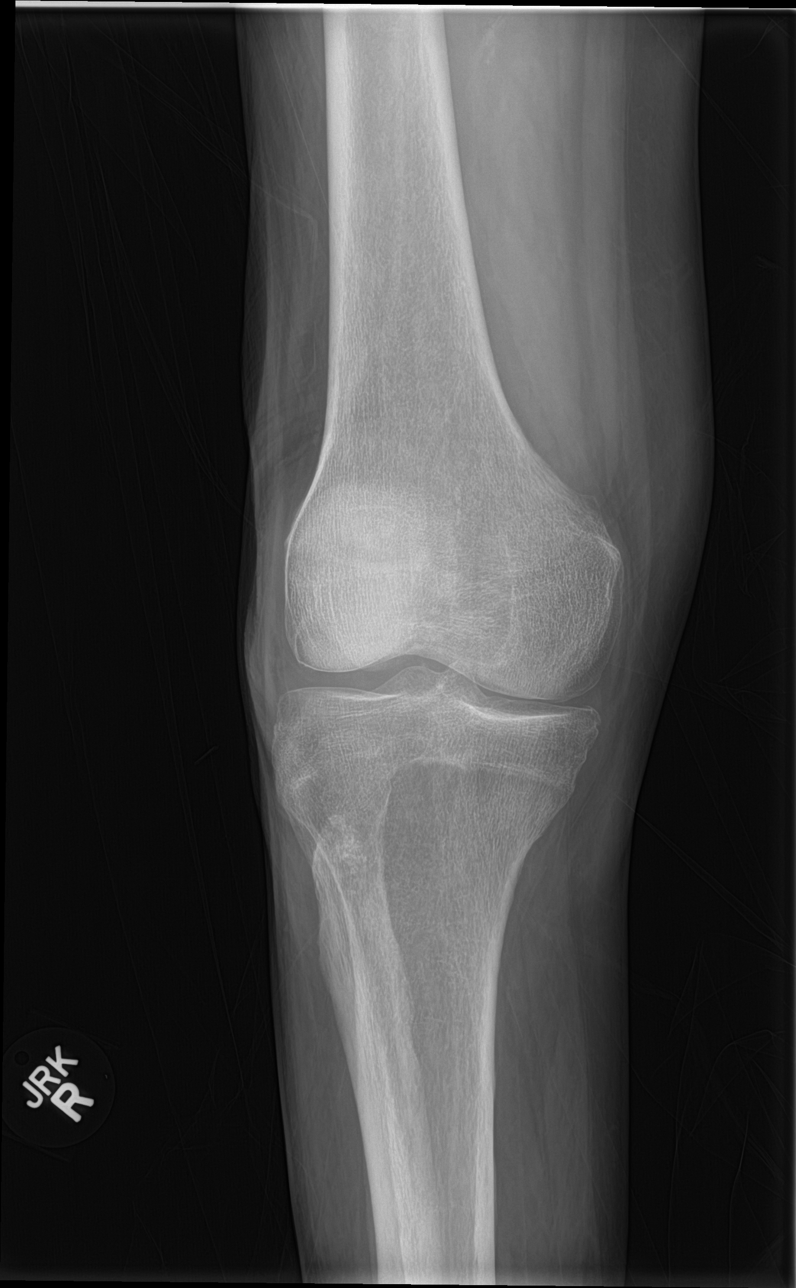

[knee obl (1 of 2)]
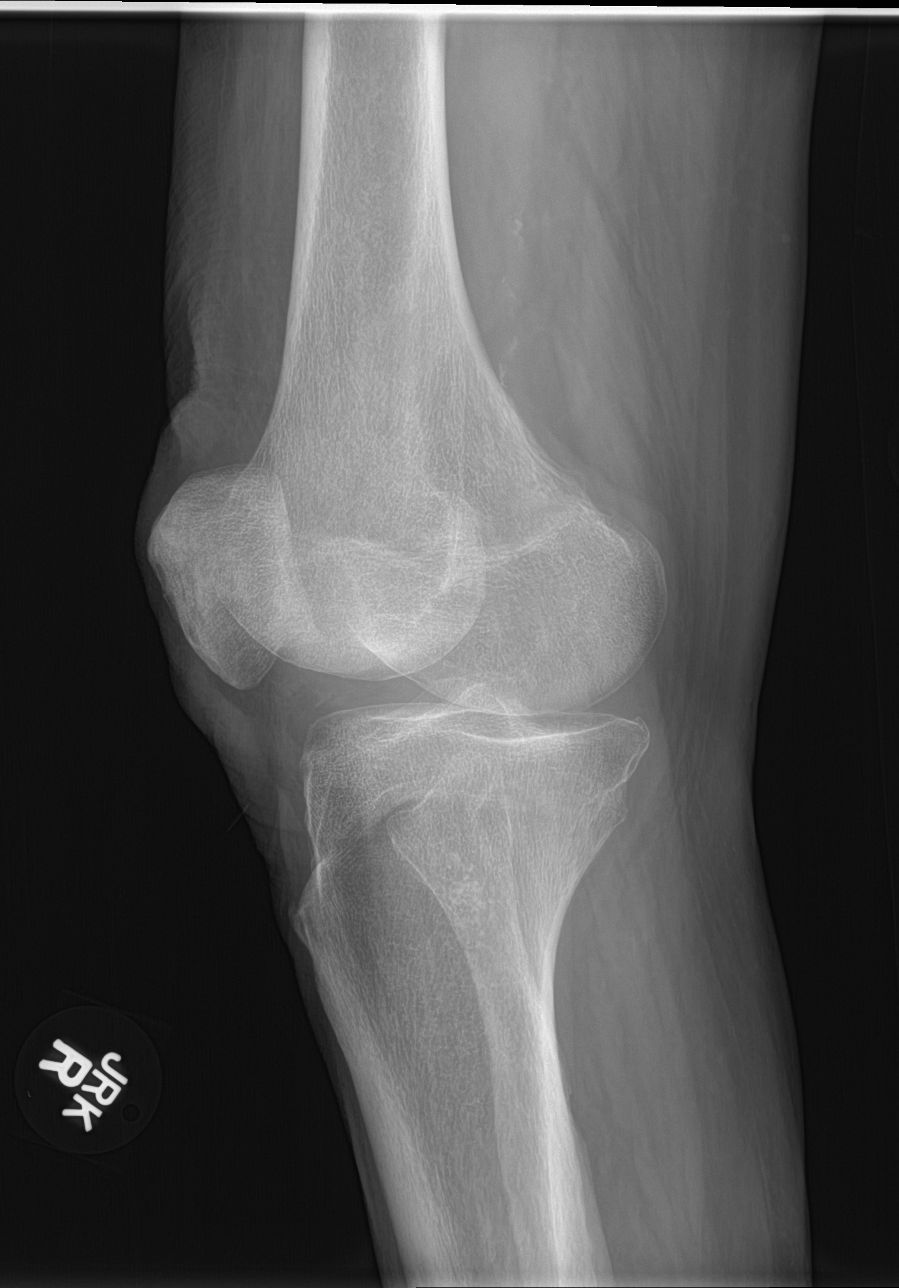

[knee obl (2 of 2)]
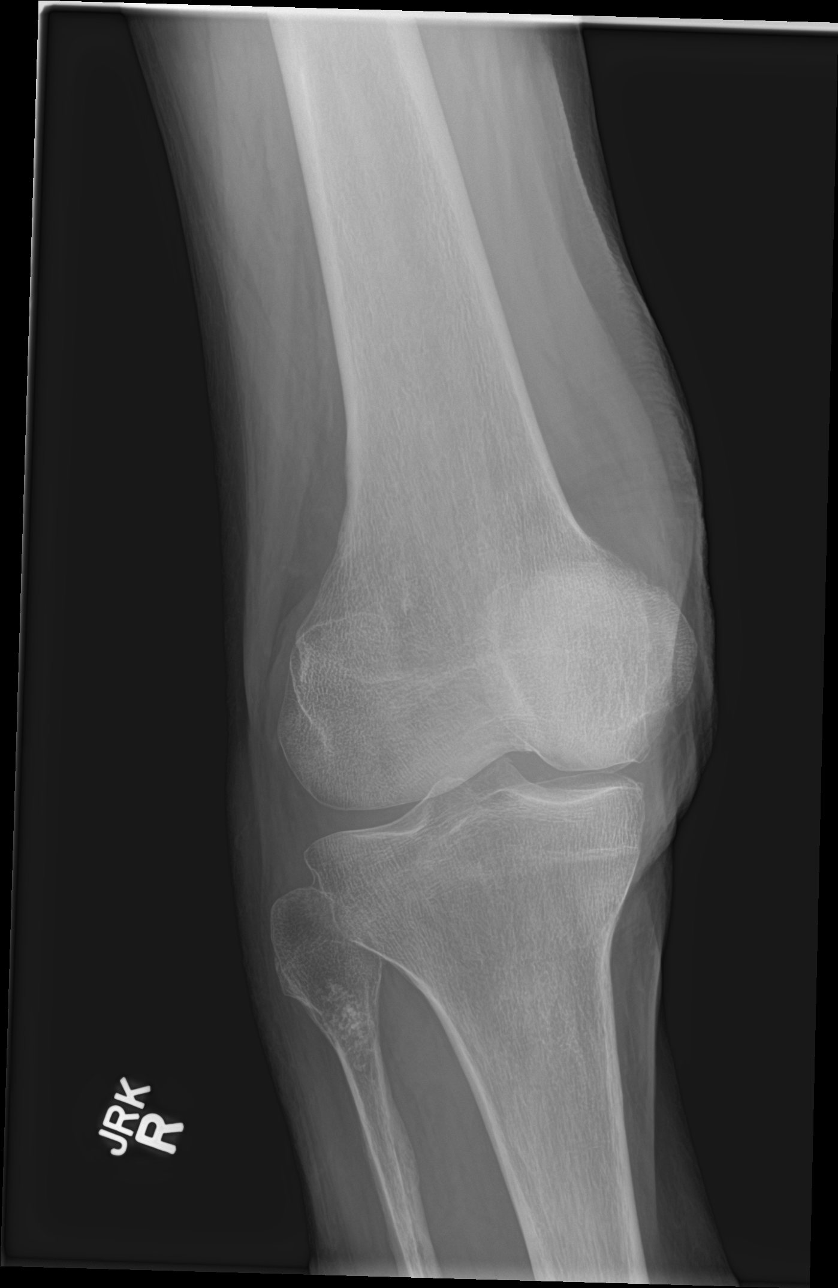

[knee lat]
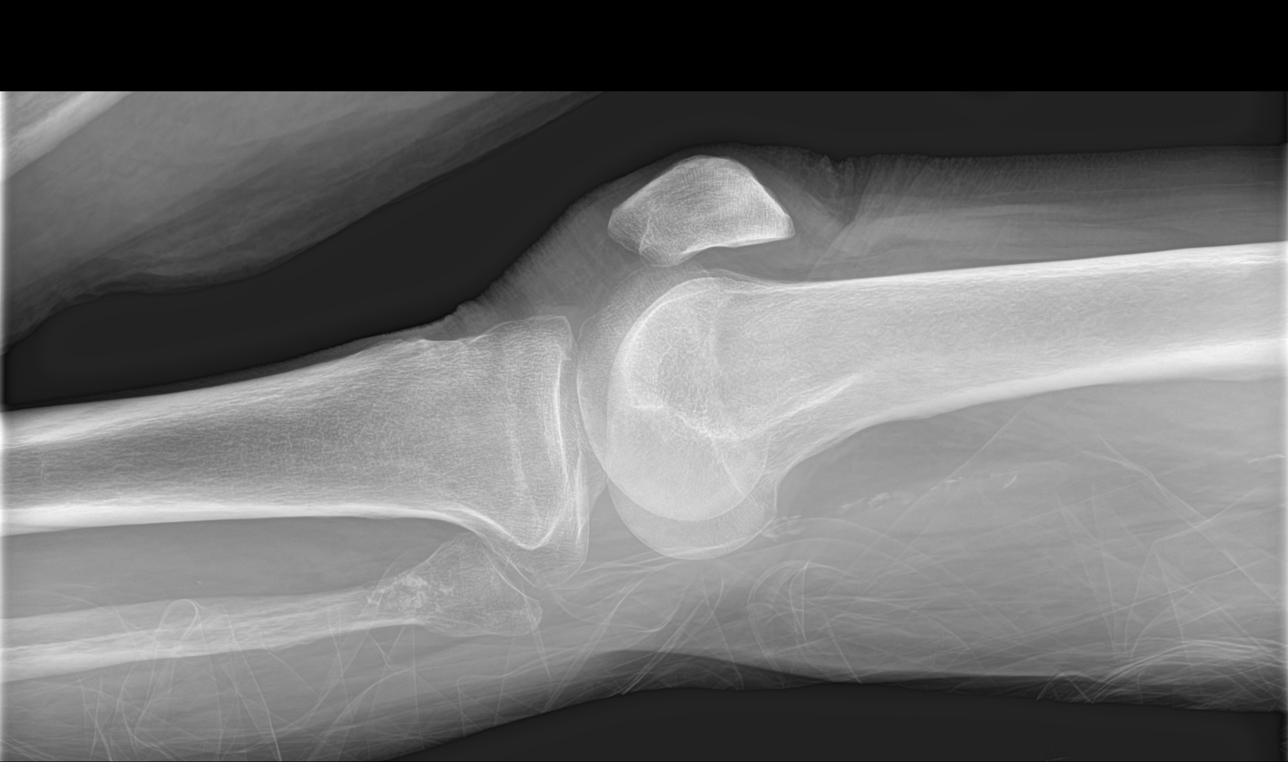

[4 of 4 positions shown; findings below may reference images not displayed]

FINDINGS: Mild medial compartment osteoarthritis with tiny marginal
osteophytes. The medial and lateral compartment joint spaces are
preserved. Tibial plateau and fibula intact. Femur intact. Known
knee effusion. Soft tissues appear within normal limits. Benign
calcified chondroid lesion in the fibular neck. Atherosclerotic
calcification in the superficial femoral and popliteal arteries.
IMPRESSION: Mild medial compartment osteoarthritis. No acute osseous
abnormality.

## 2016-09-11 IMAGING — CR DG HIP (WITH OR WITHOUT PELVIS) 1V PORT*R*
1 series · 2 of 2 positions shown · non-contrast
Comparison: Radiographs 06/12/2015

CLINICAL DATA: Postoperative images.  Right hip fracture.

EXAM:
DG HIP (WITH OR WITHOUT PELVIS) 1V PORT RIGHT

[Series 1: lat · 0.17mm/px · 2 of 2 slices shown]
[im 1/2]
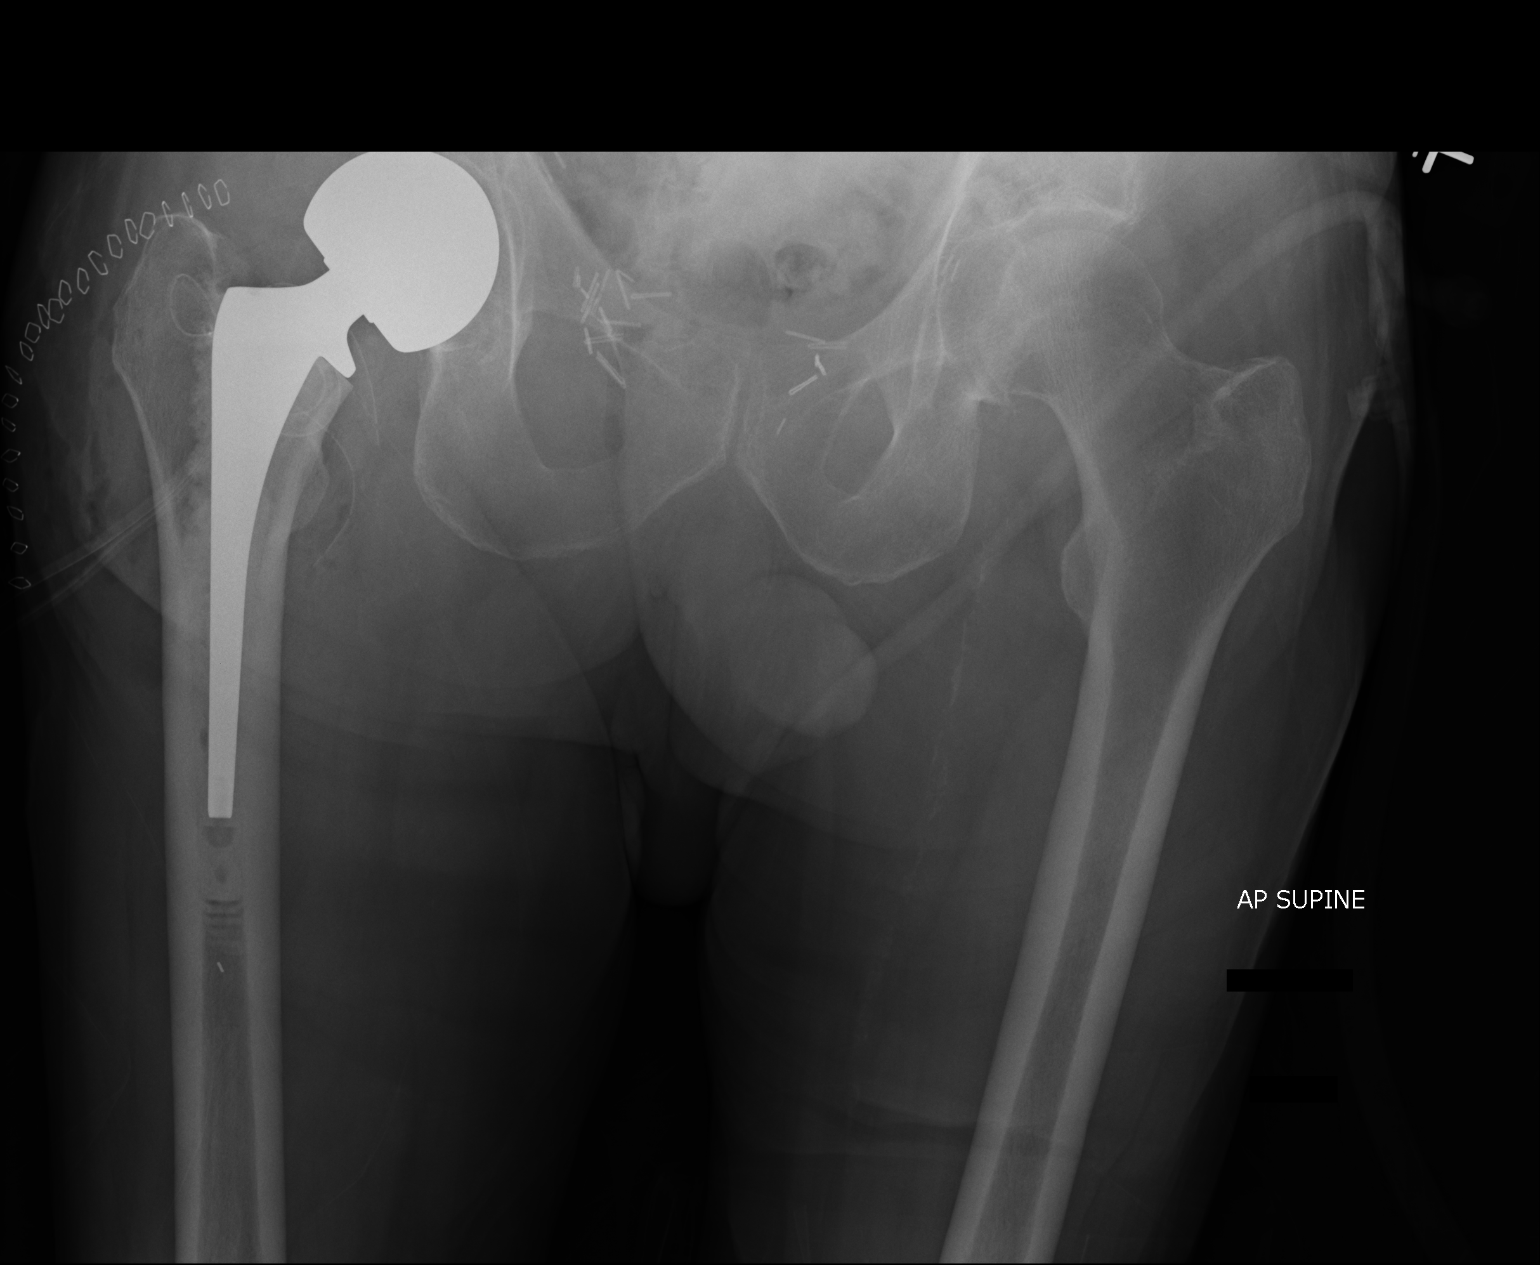
[im 2/2]
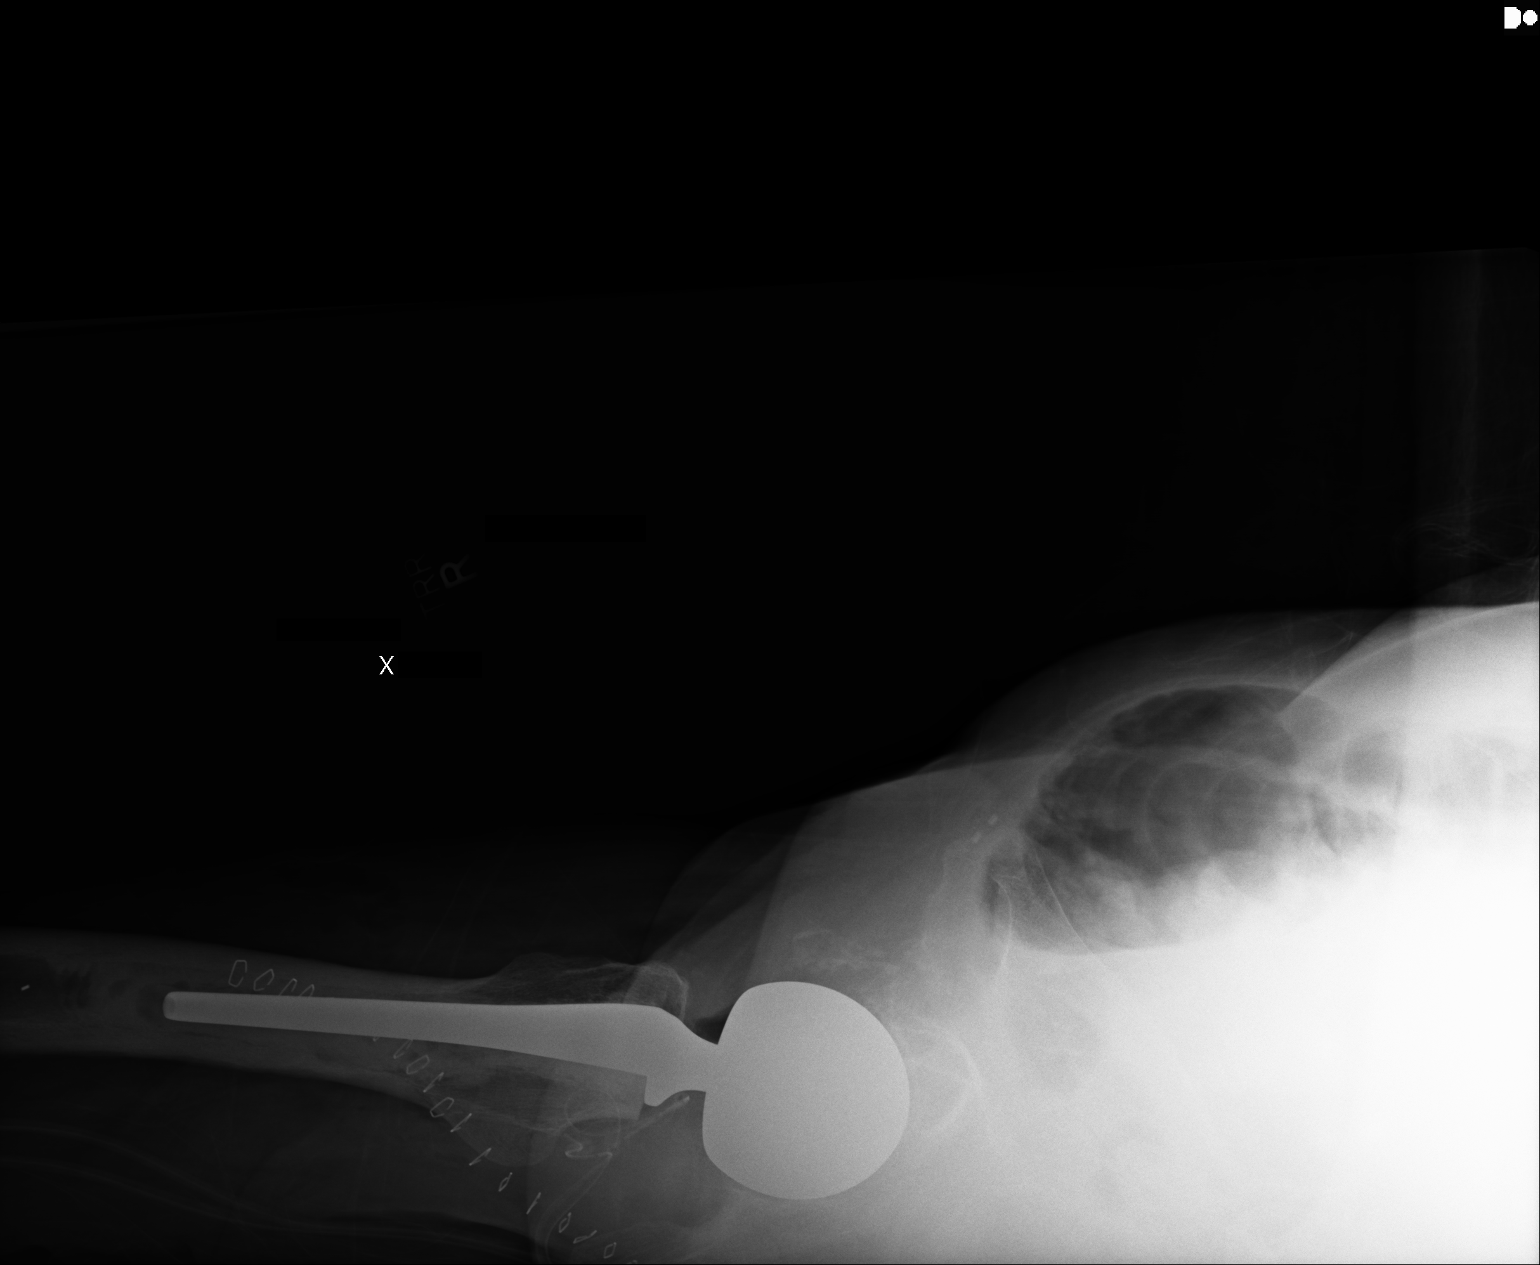

[2 of 2 positions shown; findings below may reference images not displayed]

FINDINGS: There is a right femoral prosthesis, appearing well aligned. Skin
staples are noted. No immediate complication is evident.
IMPRESSION: Right hip prosthesis.

## 2016-10-08 ENCOUNTER — Inpatient Hospital Stay

## 2016-10-08 ENCOUNTER — Inpatient Hospital Stay: Attending: Internal Medicine

## 2016-10-08 VITALS — BP 126/52 | HR 54 | Resp 18

## 2016-10-08 DIAGNOSIS — D631 Anemia in chronic kidney disease: Secondary | ICD-10-CM | POA: Diagnosis not present

## 2016-10-08 DIAGNOSIS — D508 Other iron deficiency anemias: Secondary | ICD-10-CM

## 2016-10-08 DIAGNOSIS — D72829 Elevated white blood cell count, unspecified: Secondary | ICD-10-CM | POA: Diagnosis not present

## 2016-10-08 DIAGNOSIS — I129 Hypertensive chronic kidney disease with stage 1 through stage 4 chronic kidney disease, or unspecified chronic kidney disease: Secondary | ICD-10-CM | POA: Insufficient documentation

## 2016-10-08 DIAGNOSIS — N189 Chronic kidney disease, unspecified: Principal | ICD-10-CM

## 2016-10-08 DIAGNOSIS — N184 Chronic kidney disease, stage 4 (severe): Secondary | ICD-10-CM | POA: Insufficient documentation

## 2016-10-08 LAB — CBC WITH DIFFERENTIAL/PLATELET
BASOS ABS: 0.1 10*3/uL (ref 0–0.1)
BASOS PCT: 1 %
EOS PCT: 2 %
Eosinophils Absolute: 0.2 10*3/uL (ref 0–0.7)
HCT: 25.8 % — ABNORMAL LOW (ref 40.0–52.0)
Hemoglobin: 8.2 g/dL — ABNORMAL LOW (ref 13.0–18.0)
Lymphocytes Relative: 23 %
Lymphs Abs: 2.1 10*3/uL (ref 1.0–3.6)
MCH: 33 pg (ref 26.0–34.0)
MCHC: 31.9 g/dL — ABNORMAL LOW (ref 32.0–36.0)
MCV: 103.4 fL — ABNORMAL HIGH (ref 80.0–100.0)
MONO ABS: 0.3 10*3/uL (ref 0.2–1.0)
Monocytes Relative: 4 %
NEUTROS ABS: 6.4 10*3/uL (ref 1.4–6.5)
Neutrophils Relative %: 70 %
PLATELETS: 179 10*3/uL (ref 150–440)
RBC: 2.49 MIL/uL — ABNORMAL LOW (ref 4.40–5.90)
RDW: 18.8 % — AB (ref 11.5–14.5)
WBC: 9.1 10*3/uL (ref 3.8–10.6)

## 2016-10-08 LAB — SAMPLE TO BLOOD BANK

## 2016-10-08 MED ORDER — DARBEPOETIN ALFA 300 MCG/0.6ML IJ SOSY
300.0000 ug | PREFILLED_SYRINGE | Freq: Once | INTRAMUSCULAR | Status: AC
  Administered 2016-10-08: 300 ug via SUBCUTANEOUS
  Filled 2016-10-08: qty 0.6

## 2016-11-08 ENCOUNTER — Ambulatory Visit: Payer: 59 | Admitting: Internal Medicine

## 2016-11-08 ENCOUNTER — Other Ambulatory Visit: Payer: 59

## 2016-11-08 ENCOUNTER — Ambulatory Visit: Payer: 59

## 2016-11-14 ENCOUNTER — Ambulatory Visit: Payer: 59 | Admitting: Internal Medicine

## 2016-11-14 ENCOUNTER — Ambulatory Visit: Payer: 59

## 2016-11-14 ENCOUNTER — Other Ambulatory Visit: Payer: 59

## 2016-11-14 ENCOUNTER — Telehealth: Payer: Self-pay | Admitting: *Deleted

## 2016-11-14 DIAGNOSIS — D508 Other iron deficiency anemias: Secondary | ICD-10-CM

## 2016-11-14 DIAGNOSIS — N184 Chronic kidney disease, stage 4 (severe): Secondary | ICD-10-CM

## 2016-11-14 NOTE — Telephone Encounter (Signed)
Needs CBC and CMP for re certification purposes. He has a lab appt at [facility] tomorrow, please draw labs and fax results to Hospice [phone]

## 2016-11-15 ENCOUNTER — Inpatient Hospital Stay

## 2016-11-15 ENCOUNTER — Inpatient Hospital Stay: Attending: Internal Medicine | Admitting: Internal Medicine

## 2016-11-15 ENCOUNTER — Other Ambulatory Visit: Payer: Self-pay | Admitting: Internal Medicine

## 2016-11-15 VITALS — BP 173/59 | HR 49 | Temp 96.9°F | Wt 135.0 lb

## 2016-11-15 DIAGNOSIS — D508 Other iron deficiency anemias: Secondary | ICD-10-CM

## 2016-11-15 DIAGNOSIS — N184 Chronic kidney disease, stage 4 (severe): Secondary | ICD-10-CM | POA: Diagnosis not present

## 2016-11-15 DIAGNOSIS — Z9079 Acquired absence of other genital organ(s): Secondary | ICD-10-CM | POA: Diagnosis not present

## 2016-11-15 DIAGNOSIS — Z79899 Other long term (current) drug therapy: Secondary | ICD-10-CM | POA: Diagnosis not present

## 2016-11-15 DIAGNOSIS — D631 Anemia in chronic kidney disease: Secondary | ICD-10-CM | POA: Diagnosis not present

## 2016-11-15 DIAGNOSIS — R231 Pallor: Secondary | ICD-10-CM | POA: Insufficient documentation

## 2016-11-15 DIAGNOSIS — N189 Chronic kidney disease, unspecified: Principal | ICD-10-CM

## 2016-11-15 DIAGNOSIS — I129 Hypertensive chronic kidney disease with stage 1 through stage 4 chronic kidney disease, or unspecified chronic kidney disease: Secondary | ICD-10-CM | POA: Insufficient documentation

## 2016-11-15 DIAGNOSIS — K0889 Other specified disorders of teeth and supporting structures: Secondary | ICD-10-CM | POA: Diagnosis not present

## 2016-11-15 DIAGNOSIS — Z8546 Personal history of malignant neoplasm of prostate: Secondary | ICD-10-CM | POA: Insufficient documentation

## 2016-11-15 DIAGNOSIS — R5382 Chronic fatigue, unspecified: Secondary | ICD-10-CM | POA: Insufficient documentation

## 2016-11-15 LAB — CBC WITH DIFFERENTIAL/PLATELET
BASOS ABS: 0.1 10*3/uL (ref 0–0.1)
Basophils Relative: 1 %
Eosinophils Absolute: 0.3 10*3/uL (ref 0–0.7)
Eosinophils Relative: 2 %
HEMATOCRIT: 22.7 % — AB (ref 40.0–52.0)
Hemoglobin: 7.3 g/dL — ABNORMAL LOW (ref 13.0–18.0)
LYMPHS ABS: 2.2 10*3/uL (ref 1.0–3.6)
LYMPHS PCT: 20 %
MCH: 32.6 pg (ref 26.0–34.0)
MCHC: 32.4 g/dL (ref 32.0–36.0)
MCV: 100.6 fL — AB (ref 80.0–100.0)
MONO ABS: 0.5 10*3/uL (ref 0.2–1.0)
MONOS PCT: 4 %
NEUTROS ABS: 7.8 10*3/uL — AB (ref 1.4–6.5)
Neutrophils Relative %: 73 %
Platelets: 244 10*3/uL (ref 150–440)
RBC: 2.25 MIL/uL — ABNORMAL LOW (ref 4.40–5.90)
RDW: 18.3 % — AB (ref 11.5–14.5)
WBC: 10.7 10*3/uL — ABNORMAL HIGH (ref 3.8–10.6)

## 2016-11-15 LAB — COMPREHENSIVE METABOLIC PANEL
ALT: 19 U/L (ref 17–63)
AST: 17 U/L (ref 15–41)
Albumin: 3.4 g/dL — ABNORMAL LOW (ref 3.5–5.0)
Alkaline Phosphatase: 86 U/L (ref 38–126)
Anion gap: 7 (ref 5–15)
BUN: 94 mg/dL — ABNORMAL HIGH (ref 6–20)
CHLORIDE: 115 mmol/L — AB (ref 101–111)
CO2: 16 mmol/L — ABNORMAL LOW (ref 22–32)
Calcium: 9.2 mg/dL (ref 8.9–10.3)
Creatinine, Ser: 6.56 mg/dL — ABNORMAL HIGH (ref 0.61–1.24)
GFR, EST AFRICAN AMERICAN: 8 mL/min — AB (ref >60)
GFR, EST NON AFRICAN AMERICAN: 7 mL/min — AB (ref >60)
Glucose, Bld: 84 mg/dL (ref 65–99)
POTASSIUM: 5.5 mmol/L — AB (ref 3.5–5.1)
Sodium: 138 mmol/L (ref 135–145)
Total Bilirubin: 0.3 mg/dL (ref 0.3–1.2)
Total Protein: 6.8 g/dL (ref 6.5–8.1)

## 2016-11-15 LAB — IRON AND TIBC
IRON: 120 ug/dL (ref 45–182)
SATURATION RATIOS: 48 % — AB (ref 17.9–39.5)
TIBC: 251 ug/dL (ref 250–450)
UIBC: 131 ug/dL

## 2016-11-15 LAB — SAMPLE TO BLOOD BANK

## 2016-11-15 LAB — FERRITIN: FERRITIN: 567 ng/mL — AB (ref 24–336)

## 2016-11-15 MED ORDER — DARBEPOETIN ALFA 300 MCG/0.6ML IJ SOSY
300.0000 ug | PREFILLED_SYRINGE | Freq: Once | INTRAMUSCULAR | Status: AC
  Administered 2016-11-15: 300 ug via SUBCUTANEOUS
  Filled 2016-11-15: qty 0.6

## 2016-11-15 MED ORDER — SODIUM POLYSTYRENE SULFONATE PO POWD: ORAL | 0 refills | Status: AC

## 2016-11-15 NOTE — Assessment & Plan Note (Signed)
#   Anemia of chronic kidney disease-   Hemoglobin today is 7.3. Recommend Aranesp 300  Every 4 weeks; awaiting iron studies. Declines IV iron- sec to poor tolerance. Recommend PO iron BID- 3 times a week.  #  Hypertension-  Systolic [id number] to [id number].better controlled;  Okay with Aranesp.  #  Chronic kidney disease stage IV creatinine 6.5;  Patient  Declined  Hemodialysis;   Hence no follow-up with nephrology.  # Patient is currently hospice/Continue H&H every 4 weeks; with aranesp q4 w; and f/u with MD in 3 months/cbc/bmp/iron studies.   # Above plan discussed with the patient and his daughter in detail; given a copy of labs-  For his hospice physicians.  # [phone] can leave message;  Re: Iron studies-  And recommendation for by mouth iron.

## 2016-11-15 NOTE — Progress Notes (Signed)
[facility] OFFICE PROGRESS NOTE  Patient Care Team: Leeroy Cha, MD as PCP - General (Internal Medicine)  HPI  SUMMARY of HEMATOLOGIC HISTORY:  # ANEMIA of CHRONIC RENAL DISEASE on PROCRIT 40K U q 3W; DEC 2016- Aranesp 370mcg q 4 W  # Chronic mild leucocytosis  # ? 2006- Hx PROSTATE CA [[facility]; [facility]]  # CKD [creat ~ 3.4/STAGE IV; Dr.Lateef; declined Dialysis]     INTERVAL HISTORY: Pt is a poor historian/ accompanied by his daughter.   83 year old male patient with above history of anemia of chronic renal disease  on  Aranesp Every 4 weeks visit for follow-up. He is currently in hospice.  As per the daughter as he has been stable for many months-  Hospice is considering discharging him.  Patient denies any nausea vomiting.  Chronic fatigue. Denies any blood in stools black stools.  He still goes around in a wheelchair in the assisted living.  Patient is currently on  Iron twice a day/ 2 times a week  REVIEW OF SYSTEMS:   10 point review of systems was done with the patient and are negative.  I have reviewed the past medical history, past surgical history, social history and family history with the patient and they are unchanged from previous note unless stated above.  ALLERGIES:  is allergic to ciprofloxacin and erythromycin.  MEDICATIONS:  Current Outpatient Prescriptions  Medication Sig Dispense Refill  . albuterol (PROVENTIL) (2.5 MG/3ML) 0.083% nebulizer solution Inhale into the lungs.    [name] amLODipine (NORVASC) 10 MG tablet Take 10 mg by mouth daily.     . budesonide-formoterol (SYMBICORT) 160-4.5 MCG/ACT inhaler Inhale 2 puffs into the lungs daily.     . ferrous fumarate-iron polysaccharide complex (TANDEM) 162-115.2 MG CAPS capsule 1 tablet on Monday, Wednesday, and Friday 45 capsule 5  . FLUoxetine (PROZAC) 20 MG tablet     . hydrALAZINE (APRESOLINE) 10 MG tablet Take 1 tablet by mouth 3 (three) times daily.     [name]  HYDROcodone-acetaminophen (NORCO) 5-325 MG tablet Take 1 tablet by mouth every 6 (six) hours as needed for moderate pain. 20 tablet 0  . metoprolol (LOPRESSOR) 50 MG tablet Take 50 mg by mouth 2 (two) times daily.    [name] ofloxacin (FLOXIN) 0.3 % otic solution     . omeprazole (PRILOSEC) 20 MG capsule     . ondansetron (ZOFRAN) 4 MG tablet Take 4 mg by mouth every 8 (eight) hours as needed for nausea or vomiting ("Ondansetron 4 mg every 6 hours as needed, he takes 1 every night to help him sleep"). Reported on 03/14/2016    . senna (SENOKOT) 8.6 MG tablet Take 2 tablets by mouth 3 (three) times a week.     . tiotropium (SPIRIVA) 18 MCG inhalation capsule Place into inhaler and inhale.     No current facility-administered medications for this visit.    Facility-Administered Medications Ordered in Other Visits  Medication Dose Route Frequency Provider Last Rate Last Dose  . 0.9 %  sodium chloride infusion  250 mL Intravenous Once Evlyn Kanner, NP      . ceFAZolin (ANCEF) IVPB 2 g/50 mL premix  2 g Intravenous Once Eugenie Filler, MD        PHYSICAL EXAMINATION:   BP (!) 173/59 (BP Location: Left Arm, Patient Position: Sitting)   Pulse (!) 49   Temp (!) 96.9 F (36.1 C) (Tympanic)   Wt 135 lb (61.2 kg)   BMI 21.14 kg/m  Filed Weights   11/15/16 1115  Weight: 135 lb (61.2 kg)    GENERAL:alert, no distress and comfortable. He is in a wheelchair. Accompanied by his daughter. EYES: Positive for pallor. OROPHARYNX: poor dentition.  NECK: No thyromegaly LYMPH:  no palpable lymphadenopathy in the cervical, axillary or inguinal LUNGS: clear to auscultation with normal breathing effort; No Wheeze or crackles  Cardio-vascular- Regular Rate & Rythm and no murmurs and no lower extremity edema ABDOMEN:abdomen soft, non-tender and normal bowel sounds; No hepato-splenomegaly.  Musculoskeletal:no cyanosis of digits and no clubbing  NEURO: alert & oriented x 3 with fluent speech, no focal  motor/sensory deficits. SKIN: no skin rash   LABORATORY DATA:  I have reviewed the data as listed    Component Value Date/Time   NA 138 11/15/2016 1037   K 5.5 (H) 11/15/2016 1037   CL 115 (H) 11/15/2016 1037   CO2 16 (L) 11/15/2016 1037   GLUCOSE 84 11/15/2016 1037   BUN 94 (H) 11/15/2016 1037   CREATININE 6.56 (H) 11/15/2016 1037   CALCIUM 9.2 11/15/2016 1037   PROT 6.8 11/15/2016 1037   ALBUMIN 3.4 (L) 11/15/2016 1037   AST 17 11/15/2016 1037   ALT 19 11/15/2016 1037   ALKPHOS 86 11/15/2016 1037   BILITOT 0.3 11/15/2016 1037   GFRNONAA 7 (L) 11/15/2016 1037   GFRAA 8 (L) 11/15/2016 1037    No results found for: SPEP, UPEP  Lab Results  Component Value Date   WBC 10.7 (H) 11/15/2016   NEUTROABS 7.8 (H) 11/15/2016   HGB 7.3 (L) 11/15/2016   HCT 22.7 (L) 11/15/2016   MCV 100.6 (H) 11/15/2016   PLT 244 11/15/2016      Chemistry      Component Value Date/Time   NA 138 11/15/2016 1037   K 5.5 (H) 11/15/2016 1037   CL 115 (H) 11/15/2016 1037   CO2 16 (L) 11/15/2016 1037   BUN 94 (H) 11/15/2016 1037   CREATININE 6.56 (H) 11/15/2016 1037      Component Value Date/Time   CALCIUM 9.2 11/15/2016 1037   ALKPHOS 86 11/15/2016 1037   AST 17 11/15/2016 1037   ALT 19 11/15/2016 1037   BILITOT 0.3 11/15/2016 1037       RADIOGRAPHIC STUDIES: I have personally reviewed the radiological images as listed and agreed with the findings in the report. No results found.   ASSESSMENT & PLAN:  Anemia of chronic kidney failure, stage 4 (severe) (HCC) # Anemia of chronic kidney disease-   Hemoglobin today is 7.3. Recommend Aranesp 300  Every 4 weeks; awaiting iron studies. Declines IV iron- sec to poor tolerance. Recommend PO iron BID- 3 times a week.  #  Hypertension-  Systolic 123456 to [id number] controlled;  Okay with Aranesp.  #  Chronic kidney disease stage IV creatinine 6.5;  Patient  Declined  Hemodialysis;   Hence no follow-up with nephrology.  # Patient is currently  hospice/Continue H&H every 4 weeks; with aranesp q4 w; and f/u with MD in 3 months/cbc/bmp/iron studies.   # Above plan discussed with the patient and his daughter in detail; given a copy of labs-  For his hospice physicians.  # Colletta [phone] can leave message;  Re: Iron studies-  And recommendation for by mouth iron.        Cammie Sickle, MD 11/15/2016 11:42 AM

## 2016-11-15 NOTE — Assessment & Plan Note (Deleted)
#   Anemia of chronic kidney disease-   Hemoglobin today is 7.3. Recommend Aranesp 300  Every 4 weeks; awaiting iron studies. Declines IV iron- sec to poor tolerance. Recommend PO iron BID- 3 times a week.  #  Hypertension-  Systolic 123456 to [id number] controlled;  Okay with Aranesp.  # Patient is currently hospice/Continue H&H every 4 weeks; with aranesp q4 w; and f/u with MD in 3 months/cbc/bmp/iron studies.   # Above plan discussed with the patient and his daughter in detail; given a copy of labs-  For his hospice physicians.  # Colletta [phone] can leave message;  Re: Iron studies-  And recommendation for by mouth iron.

## 2016-11-15 NOTE — Progress Notes (Signed)
Patient here today for follow up.  Patient c/o being dizzy for several years.

## 2016-11-15 NOTE — Progress Notes (Signed)
Blood pressure is slight elevated today at 173/59.  Confirmed with Dr. Aletha Halim nurse Nira Conn that MD wants to continue with the Aranesp injection today.

## 2016-11-15 NOTE — Assessment & Plan Note (Deleted)
#   Anemia of chronic kidney disease-   Hemoglobin today is 7.3. Recommend Aranesp 300  Every 4 weeks; awaiting iron studies. Declines IV iron- sec to poor tolerance. Recommend PO iron BID- 3 times a week.  #  Hypertension-  Systolic 123456 to [id number] controlled;  Okay with Aranesp.  #  Chronic kidney disease stage IV creatinine 6.5;  Patient  Declined  Hemodialysis;   Hence no follow-up with nephrology.  # Patient is currently hospice/Continue H&H every 4 weeks; with aranesp q4 w; and f/u with MD in 3 months/cbc/bmp/iron studies.   # Above plan discussed with the patient and his daughter in detail; given a copy of labs-  For his hospice physicians.  # Colletta [phone] can leave message;  Re: Iron studies-  And recommendation for by mouth iron.

## 2016-11-21 ENCOUNTER — Telehealth: Payer: Self-pay | Admitting: *Deleted

## 2016-11-21 NOTE — Telephone Encounter (Signed)
Asking if based on his results last week if his Tandem should be increased to daily from M -W - F.Advised her that per ON 1/4 it was increased to BID 3 times a week

## 2016-11-23 ENCOUNTER — Telehealth: Payer: Self-pay | Admitting: *Deleted

## 2016-11-23 NOTE — Telephone Encounter (Signed)
Patient unable to tolerate Kayexalate, abd cramping, vomiting, only had 1 loose stool, he refuses to take any more. PCP made house call yesterday and agreed he should not take any more of it

## 2016-11-23 NOTE — Telephone Encounter (Signed)
OK per VO Dr Bradd Canary

## 2016-12-13 ENCOUNTER — Other Ambulatory Visit: Payer: 59

## 2016-12-13 ENCOUNTER — Ambulatory Visit: Payer: 59

## 2016-12-13 DEATH — deceased

## 2017-01-10 ENCOUNTER — Ambulatory Visit: Payer: 59

## 2017-01-10 ENCOUNTER — Other Ambulatory Visit: Payer: 59

## 2017-02-14 ENCOUNTER — Ambulatory Visit: Payer: 59

## 2017-02-14 ENCOUNTER — Ambulatory Visit: Payer: 59 | Admitting: Internal Medicine

## 2017-02-14 ENCOUNTER — Other Ambulatory Visit: Payer: 59

## 2017-02-22 ENCOUNTER — Other Ambulatory Visit: Payer: Self-pay | Admitting: Nurse Practitioner
